# Patient Record
Sex: Female | Born: 1985 | Race: Black or African American | Hispanic: No | Marital: Married | Smoking: Never smoker
Health system: Southern US, Community
[De-identification: ages and names within clinical notes are randomized; demographics above are authoritative.]

## PROBLEM LIST (undated history)

## (undated) DIAGNOSIS — F32A Depression, unspecified: Secondary | ICD-10-CM

## (undated) DIAGNOSIS — F329 Major depressive disorder, single episode, unspecified: Secondary | ICD-10-CM

## (undated) DIAGNOSIS — R519 Headache, unspecified: Secondary | ICD-10-CM

## (undated) DIAGNOSIS — D509 Iron deficiency anemia, unspecified: Secondary | ICD-10-CM

## (undated) DIAGNOSIS — K219 Gastro-esophageal reflux disease without esophagitis: Secondary | ICD-10-CM

## (undated) DIAGNOSIS — F419 Anxiety disorder, unspecified: Secondary | ICD-10-CM

## (undated) DIAGNOSIS — N939 Abnormal uterine and vaginal bleeding, unspecified: Secondary | ICD-10-CM

## (undated) DIAGNOSIS — G43009 Migraine without aura, not intractable, without status migrainosus: Secondary | ICD-10-CM

## (undated) HISTORY — PX: REDUCTION MAMMAPLASTY: SUR839

## (undated) HISTORY — DX: Depression, unspecified: F32.A

## (undated) HISTORY — DX: Headache, unspecified: R51.9

## (undated) HISTORY — DX: Anxiety disorder, unspecified: F41.9

---
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
## 2014-04-07 DIAGNOSIS — Z903 Acquired absence of stomach [part of]: Secondary | ICD-10-CM

## 2014-04-07 HISTORY — PX: LAPAROSCOPIC GASTRIC SLEEVE RESECTION: SHX5895

## 2014-04-07 HISTORY — DX: Acquired absence of stomach (part of): Z90.3

## 2015-04-08 HISTORY — PX: BREAST BIOPSY: SHX20

## 2019-06-16 ENCOUNTER — Other Ambulatory Visit: Payer: Self-pay | Admitting: Obstetrics and Gynecology

## 2019-06-16 DIAGNOSIS — N6320 Unspecified lump in the left breast, unspecified quadrant: Secondary | ICD-10-CM

## 2019-07-01 ENCOUNTER — Other Ambulatory Visit: Payer: Self-pay

## 2019-07-01 ENCOUNTER — Ambulatory Visit
Admission: RE | Admit: 2019-07-01 | Discharge: 2019-07-01 | Disposition: A | Discharge status: Home / Self Care | Payer: BC Managed Care – PPO | Source: Ambulatory Visit | Attending: Obstetrics and Gynecology | Admitting: Obstetrics and Gynecology

## 2019-07-01 DIAGNOSIS — N6320 Unspecified lump in the left breast, unspecified quadrant: Secondary | ICD-10-CM

## 2019-07-26 NOTE — Progress Notes (Deleted)
WM[id number]    Provider:  Dr Jaynee Eagles Requesting Provider: Cyril Mourning, MD, [facility] Primary Care Provider:  Lin Landsman, MD  CC:  ***  HPI:  Cheyenne Wilson is a 34 y.o. female here as requested by Cyril Mourning, MD for transfer of care from neurology practices from [facility] neurology [place] neurologist Dr. Evalee Mutton MD.  I reviewed at least 100 pages of notes, patient has a past medical history of gastric sleeve, restless leg syndrome, and headaches, last seen May 13, 2019 for Botox injections for migraines, Roselyn Meier works acutely but only if she takes it with Excedrin.  Headache includes neck pain, photosensitivity, no tingling or numbness, she is overweight, otherwise neurologic exam is, diagnosed with chronic migraine without aura intractable without status migrainosus, other fatigue, attention and concentration deficit, vitamin B12 deficiency, insomnia, restless leg syndrome, carpal tunnel syndrome and paresthesias of the skin.For chronic migraines: 15/month, duration greater than 4 hours, apparently it was her first round of Botox injections.  Roselyn Meier as abortive.  Headache and sleep hygiene advised.For restless leg: Gabapentin, vitamin B12. For carpal tunnel encouraged use of wrist splints at night.  The headaches are usually left-sided, throbbing pressure-like in nature, photophobia and mild nauseating, twice per week at least, sumatriptan has helped but it takes a few hours, patient has lacrimation due to activation of the trigeminal pathways and migraines, headaches are not consistent with cluster headaches, also tried Zomig.  Review of notes show that she has been on the following medications that can be used for migraines: Botox for migraines, Ubrelvy, Excedrin, sumatriptan, amitriptyline up to 20mg , Maxalt, Horizant, Topamax 25, gabapentin 300mg , Zomig, Horizant 600 mg nightly  Reviewed notes, labs and imaging from outside  physicians, which showed ***  Previous B12 was 272, T4 and TSH were normal, ANA negative, sedimentation rate 23, Lyme negative, B12 272, CRP less than 1, (noted IgM P 23 antibodies were present in the Lyme testing which still does not constitute a positive test.  Labs collected Aug 27, 2018.  Tests at prior neurologist office include sleep study, EEG study, ADHD screening, transcranial Doppler study, carotid Doppler duplex scanning.  She is also had an EMG of the lower extremities from her prior neurologist constantly feels the need to move her feet.  EMG of the upper extremities was consistent with carpal tunnel syndrome, appears she has been getting B12 injections.  Reviewed carotid ultrasound report, hemodynamically normal study, minimal plaque in the visualized portion of the cerebrovascular circulation, exam date September 06, 2018.  EEG, reviewed report, completed September 07, 2018, normal routine EEG study for approximately 2 hours.  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  I reviewed EMG dated November 11, 2018 lower extremities including peroneal motors, tibial motors, superior peroneal sensory, sural sensory's bilateral which were all unremarkable, F waves and H reflex were all within normal limits, all examined muscles were normal, normal electrophysiologic study without evidence of a significant entrapment syndrome, radiculopathy or myopathy.  I reviewed MRI of the brain dated September 06, 2018, normal MRI of the brain noncontrast.  Patient had a brain fitness  profile September 06, 2018 which showed there were no cognitive areas in need of improvement, areas of strength included memory, executive function, attention, visual-spatial, verbal function, problem solving, working memory, areas needing improvement included none.  I reviewed his sleep study from Aug 31, 2018, 3 night overnight EEG study, sleep efficiency of 90.3%, 82%, 87.8% respectively but it appears patient did have insomnia per report her AHI was 0.7.  TCD confirmed the patency of the major basal intracranial arteries of the circle of Willis, no evidence of intracranial stenosis or occlusive disease, BMR testing showed normal vasodilator reactivity in the right MCA, no emboli were detected during monitoring.  Review of Systems: Patient complains of symptoms per HPI as well as the following symptoms ***. Pertinent negatives and positives per HPI. All others negative.   Social History   Socioeconomic History  . Marital status: Married    Spouse name: Not on file  . Number of children: Not on file  . Years of education: Not on file  . Highest education level: Not on file  Occupational History  . Not on file  Tobacco Use  . Smoking status: Not on file  Substance and Sexual Activity  . Alcohol use: Not on file  . Drug use: Not on file  . Sexual activity: Not on file  Other Topics Concern  . Not on file  Social History Narrative  . Not on file   Social Determinants of Health   Financial Resource Strain:   . Difficulty of Paying Living Expenses:   Food Insecurity:   . Worried About Charity fundraiser in the Last Year:   . Arboriculturist in the Last Year:   Transportation Needs:   . Film/video editor (Medical):   Marland Kitchen Lack of Transportation (Non-Medical):   Physical Activity:   . Days of Exercise per Week:   . Minutes of Exercise per Session:   Stress:   . Feeling of Stress :   Social Connections:   . Frequency of Communication with Friends and Family:   . Frequency of  Social Gatherings with Friends and Family:   . Attends Religious Services:   . Active Member of Clubs or Organizations:   . Attends Archivist Meetings:   Marland Kitchen Marital Status:   Intimate Partner Violence:   . Fear of Current or Ex-Partner:   . Emotionally Abused:   Marland Kitchen Physically Abused:   . Sexually Abused:     Family History  Problem Relation Age of Onset  . Breast cancer Mother 51    No past medical history on file.  There are no problems to display for this patient.   Past Surgical History:  Procedure Laterality Date  . BREAST BIOPSY Left 2017  . REDUCTION MAMMAPLASTY Bilateral     No current outpatient medications on file.   No current facility-administered medications for this visit.    Allergies as of 07/27/2019  . (Not on File)    Vitals: There were no vitals taken for this visit. Last Weight:  Wt Readings from Last 1 Encounters:  No data found for Wt   Last Height:   Ht Readings from Last 1 Encounters:  No data found for Ht     Physical exam: Exam: Gen: NAD, conversant, well nourised, obese, well groomed                     CV: RRR, no MRG. No Carotid  Bruits. No peripheral edema, warm, nontender Eyes: Conjunctivae clear without exudates or hemorrhage  Neuro: Detailed Neurologic Exam  Speech:    Speech is normal; fluent and spontaneous with normal comprehension.  Cognition:    The patient is oriented to person, place, and time;     recent and remote memory intact;     language fluent;     normal attention, concentration,     fund of knowledge Cranial Nerves:    The pupils are equal, round, and reactive to light. The fundi are normal and spontaneous venous pulsations are present. Visual fields are full to finger confrontation. Extraocular movements are intact. Trigeminal sensation is intact and the muscles of mastication are normal. The face is symmetric. The palate elevates in the midline. Hearing intact. Voice is normal. Shoulder shrug  is normal. The tongue has normal motion without fasciculations.   Coordination:    Normal finger to nose and heel to shin. Normal rapid alternating movements.   Gait:    Heel-toe and tandem gait are normal.   Motor Observation:    No asymmetry, no atrophy, and no involuntary movements noted. Tone:    Normal muscle tone.    Posture:    Posture is normal. normal erect    Strength:    Strength is V/V in the upper and lower limbs.      Sensation: intact to LT     Reflex Exam:  DTR's:    Deep tendon reflexes in the upper and lower extremities are normal bilaterally.   Toes:    The toes are downgoing bilaterally.   Clonus:    Clonus is absent.    Assessment/Plan:    No orders of the defined types were placed in this encounter.  No orders of the defined types were placed in this encounter.   Cc: Cyril Mourning, MD,  Lin Landsman, MD  Sarina Ill, MD  [facility] [address] [place], [place] [address]  Phone [phone] Fax [phone]

## 2019-07-27 ENCOUNTER — Other Ambulatory Visit: Payer: Self-pay

## 2019-07-27 ENCOUNTER — Ambulatory Visit: Payer: BC Managed Care – PPO | Admitting: Neurology

## 2019-07-28 ENCOUNTER — Encounter: Payer: Self-pay | Admitting: Neurology

## 2019-07-28 ENCOUNTER — Other Ambulatory Visit: Payer: Self-pay

## 2019-07-28 ENCOUNTER — Ambulatory Visit: Payer: BC Managed Care – PPO | Admitting: Neurology

## 2019-07-28 VITALS — BP 118/78 | HR 82 | Temp 97.8°F | Ht 64.0 in | Wt 223.0 lb

## 2019-07-28 DIAGNOSIS — G43019 Migraine without aura, intractable, without status migrainosus: Secondary | ICD-10-CM | POA: Diagnosis not present

## 2019-07-28 MED ORDER — AIMOVIG 140 MG/ML ~~LOC~~ SOAJ: 140.0000 mg | SUBCUTANEOUS | 5 refills | Status: DC

## 2019-07-28 MED ORDER — NURTEC 75 MG PO TBDP: 75.0000 mg | ORAL_TABLET | ORAL | 3 refills | Status: DC | PRN

## 2019-07-28 NOTE — Patient Instructions (Addendum)
As discussed, we will try you on Nurtec as opposed to [place] for your acute migraine attacks.   Nurtec 75 mg strength: Take 1 pill at onset of migraine headache, may repeat in 2 hours, no more than 2 pills in 24 hours. May cause sedation and nausea.   You have tried a failed multiple abortive and preventative treatments for your migraines.  You are a good candidate for one of the newer injectable medications, such as Aimovig, Ajovy and Emgality.   As discussed, we will start Aimovig 140 mg/ml, 1 inj subcutaneously every 30 days.  We can help you with your first injection and education, if needed.  Potential side effects include:  Gastrointestinal: Constipation (3%) Immunologic: Antibody development (3% to 6%) Local: Injection site reaction (5% to 6%) Neuromuscular & skeletal: Muscle cramps (?2%), muscle spasm (?2%) Dermatologic: Injection site reactions include itching, redness, pain at the injection site and very rarely: Anaphylaxis, angioedema (severe swelling including around mouth and tongue), hypersensitivity reaction (rare).

## 2019-07-28 NOTE — Progress Notes (Signed)
Subjective:    Patient ID: Cheyenne Wilson is a 34 y.o. female.  HPI     Star Age, MD, PhD [facility] [address] [place], [place] [address]  Dear Dr. Evalee Mutton,  I saw your patient, Cheyenne Wilson, upon your kind request, in my Neurologic clinic today for initial consultation of her migraine headaches.  The patient is unaccompanied today.  As you know, [name] is a 34 year old right-handed woman with an underlying medical history of obesity, s/p gastric sleeve, RLS and migraine headaches, who reports a longstanding history of migraine headaches.  She has no family history of migraines. She has recently moved from [place].  She has a left-sided throbbing headache, typically around the eye and behind the eye, sometimes in the back of her head.  She is currently on [place] which she does not feel has helped.  She is no longer taking Horizant, her primary care tried ropinirole for her restless legs which caused her to have nausea and vomiting.  She does not take gabapentin any longer.  She denies any visual aura, she denies any neurological accompaniments with her headache which can happen 2-3 times a week, may last 24 to 48 hours.  She is married and lives with her spouse and 2 children, ages 54 and 1, she is a Agricultural engineer.  She is a non-smoker and drinks alcohol occasionally, once or twice a week, caffeine and limitation, 1 cup of coffee per day, she tries to hydrate well and sleeps fairly well, takes melatonin at night.  She has been on Imitrex and Maxalt, neither one helped, she took botox injections, which did not help, last injection on 05/13/2019.  She had extensive evaluation through your office, all within the last year.  Upper extremity EMG nerve conduction velocity test on 01/14/2019 showed borderline carpal tunnel syndrome, lower extremity EMG nerve conduction test on 11/11/2018 showed no significant evidence of  neuropathy, carotid Doppler ultrasound on 09/06/2018 showed no significant internal carotid artery stenosis on either side.  EEG on 09/07/2018 showed: Impression: This is a normal routine EEG study.  Transcranial Doppler ultrasound on 09/06/2018 was unremarkable, MRI brain without contrast normal findings, cognitive testing with neuro trax on 09/06/2018 showed no obvious abnormalities, extended 3-night EEG from 5/26 through 09/02/2018 showed no abnormalities, sleep study on 12/06/2018 was negative for sleep apnea. She has been on several preventative medications, including Topamax, and amitriptyline which did not help.  Gabapentin did not help.  She is interested in one of the newer injectable medications for headache prevention.  Her Past Medical History Is Significant For: Past Medical History:  Diagnosis Date  . Anxiety   . Depression   . Head ache     Her Past Surgical History Is Significant For: Past Surgical History:  Procedure Laterality Date  . BREAST BIOPSY Left 2017  . REDUCTION MAMMAPLASTY Bilateral     Her Family History Is Significant For: Family History  Problem Relation Age of Onset  . Breast cancer Mother 28  . Hypertension Mother   . Heart disease Father     Her Social History Is Significant For: Social History   Socioeconomic History  . Marital status: Married    Spouse name: Not on file  . Number of children: Not on file  . Years of education: Not on file  . Highest education level: Not on file  Occupational History  . Not on file  Tobacco Use  . Smoking status: Never Smoker  .  Smokeless tobacco: Never Used  Substance and Sexual Activity  . Alcohol use: Yes    Comment: 1-2 drinks per week  . Drug use: Never  . Sexual activity: Not on file  Other Topics Concern  . Not on file  Social History Narrative  . Not on file   Social Determinants of Health   Financial Resource Strain:   . Difficulty of Paying Living Expenses:   Food Insecurity:   . Worried About  Charity fundraiser in the Last Year:   . Arboriculturist in the Last Year:   Transportation Needs:   . Film/video editor (Medical):   Marland Kitchen Lack of Transportation (Non-Medical):   Physical Activity:   . Days of Exercise per Week:   . Minutes of Exercise per Session:   Stress:   . Feeling of Stress :   Social Connections:   . Frequency of Communication with Friends and Family:   . Frequency of Social Gatherings with Friends and Family:   . Attends Religious Services:   . Active Member of Clubs or Organizations:   . Attends Archivist Meetings:   Marland Kitchen Marital Status:     Her Allergies Are:  No Known Allergies:   Her Current Medications Are:  Outpatient Encounter Medications as of 07/28/2019  Medication Sig  . CYANOCOBALAMIN IJ Inject as directed. Monthly  . Levocetirizine Dihydrochloride (XYZAL PO) Take by mouth.  Marland Kitchen PREDNISONE PO Take by mouth.  Marland Kitchen rOPINIRole HCl (REQUIP PO) Take by mouth.  . Ubrogepant (UBRELVY) 50 MG TABS Take by mouth.   No facility-administered encounter medications on file as of 07/28/2019.  :  Review of Systems:  Out of a complete 14 point review of systems, all are reviewed and negative with the exception of these symptoms as listed below:  Review of Systems  Neurological:       Here for consult on h/a. Reports 3 ha per week. Currently on ubrelvy. Has tried botox in the past but does not want to continue. Would like to discuss injections for migraine prevention.     Objective:  Neurological Exam  Physical Exam Physical Examination:   Vitals:   07/28/19 1257  BP: 118/78  Pulse: 82  Temp: 97.8 F (36.6 C)   General Examination: The patient is a very pleasant 34 y.o. female in no acute distress. She appears well-developed and well-nourished and well groomed.   HEENT: Normocephalic, atraumatic, pupils are equal, round and reactive to light and accommodation. Funduscopic exam is normal with sharp disc margins noted. Extraocular tracking  is good without limitation to gaze excursion or nystagmus noted. Normal smooth pursuit is noted. Hearing is grossly intact. Tympanic membranes are clear bilaterally. Face is symmetric with normal facial animation and normal facial sensation. Speech is clear with no dysarthria noted. There is no hypophonia. There is no lip, neck/head, jaw or voice tremor. Neck is supple with full range of passive and active motion. There are no carotid bruits on auscultation. Oropharynx exam reveals: no signif. mouth dryness, good dental hygiene, slightly longer uvula noted. Tongue protrudes centrally and palate elevates symmetrically.   Chest: Clear to auscultation without wheezing, rhonchi or crackles noted.  Heart: S1+S2+0, regular and normal without murmurs, rubs or gallops noted.   Abdomen: Soft, non-tender and non-distended with normal bowel sounds appreciated on auscultation.  Extremities: There is no pitting edema in the distal lower extremities bilaterally. Pedal pulses are intact.  Skin: Warm and dry without trophic changes noted. There  are no varicose veins.  Musculoskeletal: exam reveals no obvious joint deformities, tenderness or joint swelling or erythema.   Neurologically:  Mental status: The patient is awake, alert and oriented in all 4 spheres. Her immediate and remote memory, attention, language skills and fund of knowledge are appropriate. There is no evidence of aphasia, agnosia, apraxia or anomia. Speech is clear with normal prosody and enunciation. Thought process is linear. Mood is normal and affect is normal.  Cranial nerves II - XII are as described above under HEENT exam. In addition: shoulder shrug is normal with equal shoulder height noted. Motor exam: Normal bulk, strength and tone is noted. There is no drift, tremor or rebound. Romberg is negative. Reflexes are 2+ throughout. Babinski: Toes are flexor bilaterally. Fine motor skills and coordination: intact with normal finger taps, normal  hand movements, normal rapid alternating patting, normal foot taps and normal foot agility.  Cerebellar testing: No dysmetria or intention tremor on finger to nose testing. Heel to shin is unremarkable bilaterally. There is no truncal or gait ataxia.  Sensory exam: intact to light touch, vibration, temperature sense in the upper and lower extremities.  Gait, station and balance: She stands easily. No veering to one side is noted. No leaning to one side is noted. Posture is age-appropriate and stance is narrow based. Gait shows normal stride length and normal pace. No problems turning are noted. Tandem walk is unremarkable.   Assessment and Plan:  Assessment and Plan:  In summary, Chau C. Wilson is a very pleasant 34 y.o.-year old female with an underlying medical history of obesity, s/p gastric sleeve, RLS and migraine headaches, who presents for evaluation of her migraine headaches.  She has a longstanding history of migraines and has tried and failed multiple preventative medications including amitriptyline, gabapentin, Topamax, and Botox.  She has been on several abortive medications including Maxalt, Imitrex, and Ubrelvy, neither one helped.  She is interested in one of the newer injectable preventative medications.  She would be a good candidate for Aimovig.  I provided instructions and a new prescription for this medication we talked about expectations and potential side effects.  She is advised to try Nurtec for acute treatment, she was provided a new prescription and instructions.  She is advised not to combine Fate with this medication as both are in the same family of medications.  She has a nonfocal neurological exam.  She reports having had a eye examination within the last 6 months, she has prescription eyeglasses for astigmatism, and uses her glasses as needed.  She is advised about potential headache triggers.  She tries to hydrate well and rest well.  She had multiple  diagnostic tests through your office, I do not believe she needs any additional diagnostic evaluation from my end of things.  She is advised to follow-up routinely in our office to see one of our nurse practitioners.  I answered all her questions today and she was in agreement.   Thank you very much for allowing me to participate in the care of this nice patient. If I can be of any further assistance to you please do not hesitate to call me at [phone].  Sincerely,   Star Age, MD, PhD

## 2019-08-22 ENCOUNTER — Telehealth: Payer: Self-pay | Admitting: Neurology

## 2019-08-22 NOTE — Telephone Encounter (Signed)
Pt states that her insurance is still waiting on the order for her Erenumab-aooe (AIMOVIG) 140 MG/ML SOAJ to get the PA going and pt would like to know the update on this. Please advise.

## 2019-08-23 NOTE — Telephone Encounter (Signed)
PA done on cover my meds. [facility] is processing your inquiry and will respond shortly with next steps. You are currently using the fastest method to process this request. Please do not fax or call [facility] [employer] to resubmit this request. To check for an update later, open this request again from your dashboard. If you need assistance, please chat with CoverMyMeds or call us at [phone].

## 2019-08-23 NOTE — Telephone Encounter (Signed)
I called pt that a fax was not receive from pharmacy for [place]. I stated PA was done today and will pend for 24 to 72 hours. Pt verbalized understanding.

## 2019-08-24 NOTE — Telephone Encounter (Signed)
Revised. 

## 2019-08-24 NOTE — Telephone Encounter (Addendum)
I could not do PA on cover my meds It was initiated yesterday and questions were not on the PA. I called [phone] pt  Insurance to state PA did not imitated clinical questions. I was hold for 15 minutes to do clinical questions. I had to hang up. I will call tomorrow to get customer service on line to do PA again.

## 2019-08-25 NOTE — Telephone Encounter (Signed)
Check cover my meds, clinical questions finally there to do. Clinical questions about Aimovig send to insurance. I also fax office notes to  [phone] to cover my meds so they can upload for the pharmacy to review. Decision still pending.for 24 to 72 hours.

## 2019-08-29 NOTE — Telephone Encounter (Signed)
I called pt that the pharmacy denied the aimvovig. I advise pt to use the access card because she has Pharmacist, community.Pt verbalized understanding.

## 2019-08-30 NOTE — Telephone Encounter (Signed)
I tried to call patients insurance plan on 5/24 an 5/25 to find out about the office notes for the PA. Per pt they never receive the office notes. I call  [phone] and was on hold both times for 20 minutes.

## 2019-08-31 ENCOUNTER — Ambulatory Visit: Payer: BC Managed Care – PPO | Admitting: Neurology

## 2019-09-07 ENCOUNTER — Ambulatory Visit (INDEPENDENT_AMBULATORY_CARE_PROVIDER_SITE_OTHER): Payer: BC Managed Care – PPO | Admitting: Otolaryngology

## 2019-09-26 ENCOUNTER — Telehealth: Payer: Self-pay | Admitting: Neurology

## 2019-09-26 NOTE — Telephone Encounter (Signed)
Pt has called to report that a PA is needed for her Rimegepant Sulfate (NURTEC) 75 MG TBDP.  Pt is asking for a call to discuss this

## 2019-09-27 NOTE — Telephone Encounter (Signed)
PA has been completed via paper and will be faxed to [facility] once MD signs.

## 2019-09-28 NOTE — Telephone Encounter (Signed)
PA has been submitted for Nurtec. PA is pending and will update the pt would decision from insurance is made.

## 2019-09-30 ENCOUNTER — Telehealth: Payer: Self-pay | Admitting: Neurology

## 2019-09-30 NOTE — Telephone Encounter (Signed)
She called on Friday asking about her Nurtec preauthorization.  I let her know that you had worked on it this week and that I would forward the message to you

## 2019-10-03 NOTE — Telephone Encounter (Signed)
I contacted the pt and left a vm (ok per dpr). I advised via that the PA was still pending at [facility]. I also advised our office had technical issues on wed and Thursday and could not receive any fax or calls. Pt was advised while we are waiting on the authorization she could sign up for the co-pay card and help override the PA needed at the pharmacy...  Pt was advised to call back if she had any questions.

## 2019-10-12 ENCOUNTER — Telehealth: Payer: Self-pay

## 2019-10-12 NOTE — Telephone Encounter (Signed)
Pt called stating she is having trouble getting her Rimegepant Sulfate (NURTEC) 75 MG TBDP filled due to it needing a PA and her pharmacy informed her that the copay card was a one time use. Please advise.

## 2019-10-12 NOTE — Telephone Encounter (Signed)
I called the pt back and advised we are still waiting on the PA to be approved. I called the insurance today and was advised I could not complete a verbal PA. I have resubmitted the fax PA.  Cover my meds could not locate the eligibility of the pt when tried to submit.

## 2019-10-12 NOTE — Telephone Encounter (Signed)
Cheyenne Wilson is a 34 y.o. female returning a call to [place].   Pt was notified of the messge re: PA still pending

## 2019-10-12 NOTE — Telephone Encounter (Signed)
Thank you :)

## 2019-10-18 NOTE — Telephone Encounter (Signed)
PA has been approved through [place]-  PA effective 10/18/2019 -10/17/2020. Pharmacy has been notified.

## 2019-10-26 ENCOUNTER — Encounter: Payer: Self-pay | Admitting: Neurology

## 2019-10-26 ENCOUNTER — Ambulatory Visit: Payer: BC Managed Care – PPO | Admitting: Neurology

## 2019-10-26 ENCOUNTER — Other Ambulatory Visit: Payer: Self-pay

## 2019-10-26 VITALS — BP 122/82 | HR 68 | Ht 65.0 in | Wt 227.0 lb

## 2019-10-26 DIAGNOSIS — L299 Pruritus, unspecified: Secondary | ICD-10-CM | POA: Diagnosis not present

## 2019-10-26 DIAGNOSIS — G43019 Migraine without aura, intractable, without status migrainosus: Secondary | ICD-10-CM | POA: Diagnosis not present

## 2019-10-26 NOTE — Progress Notes (Signed)
Subjective:    Patient ID: Cheyenne Wilson is a 35 y.o. female.  HPI     Interim history:  Cheyenne Wilson is a 34 year old right-handed woman with an underlying medical history of obesity, s/p gastric sleeve, RLS and migraine headaches, who Presents for follow-up consultation of her migraine headaches.  The patient is unaccompanied today.  I first met her on 07/28/2019, at which time she reported a longstanding history of migraine headaches.  She had moved from [place].  She was under the care of a neurologist.  She had a prescription for Ubrelvy.  She was advised to start Aimovig injections.  Her insurance did not cover Nurtec.  Today, 10/26/2019: She reports that her migraines are better with the Aimovig injections, she took her first dose on June 1 and her second dose on July 1.  In the past month, she has had intermittent sensations of something crawling on or under her skin without any obvious rash, no urticaria, no actual itching.  No injection site reaction, no swelling of the lips or tongue.  She has seen ENT and dermatology.  She has not noticed any obvious connection to the Aimovig but that is the only recent medication but she also recently started phentermine.  She stopped the phentermine a few days ago and is due for her Aimovig injection on August 1.  She does believe it has helped her migraines.  She was not able to get the Nurtec prescription and has not filled her [facility] yet.    The patient's allergies, current medications, family history, past medical history, past social history, past surgical history and problem list were reviewed and updated as appropriate.   Previously:   07/28/19: (She) reports a longstanding history of migraine headaches.  She has no family history of migraines. She has recently moved from [place].  She has a left-sided throbbing headache, typically around the eye and behind the eye, sometimes in the back of her head.  She is currently on  [place] which she does not feel has helped.  She is no longer taking Horiz[place], her primary care tried ropinirole for her restless legs which caused her to have nausea and vomiting.  She does not take gabapentin any longer.  She denies any visual aura, she denies any neurological accompaniments with her headache which can happen 2-3 times a week, may last 24 to 48 hours.  She is married and lives with her spouse and 2 children, ages 18 and 34, she is a Agricultural engineer.  She is a non-smoker and drinks alcohol occasionally, once or twice a week, caffeine and limitation, 1 cup of coffee per day, she tries to hydrate well and sleeps fairly well, takes melatonin at night.   She has been on Imitrex and Maxalt, neither one helped, she took botox injections, which did not help, last injection on 05/13/2019.  She had extensive evaluation through your office, all within the last year.  Upper extremity EMG nerve conduction velocity test on 01/14/2019 showed borderline carpal tunnel syndrome, lower extremity EMG nerve conduction test on 11/11/2018 showed no significant evidence of neuropathy, carotid Doppler ultrasound on 09/06/2018 showed no significant internal carotid artery stenosis on either side.  EEG on 09/07/2018 showed: Impression: This is a normal routine EEG study.  Transcranial Doppler ultrasound on 09/06/2018 was unremarkable, MRI brain without contrast normal findings, cognitive testing with neuro trax on 09/06/2018 showed no obvious abnormalities, extended 3-night EEG from 5/26 through 09/02/2018 showed no abnormalities, sleep study on 12/06/2018 was  negative for sleep apnea. She has been on several preventative medications, including Topamax, and amitriptyline which did not help.  Gabapentin did not help.  She is interested in one of the newer injectable medications for headache prevention.  Her Past Medical History Is Significant For: Past Medical History:  Diagnosis Date  . Anxiety   . Depression   . Head ache     Her  Past Surgical History Is Significant For: Past Surgical History:  Procedure Laterality Date  . BREAST BIOPSY Left 2017  . REDUCTION MAMMAPLASTY Bilateral     Her Family History Is Significant For: Family History  Problem Relation Age of Onset  . Breast cancer Mother 42  . Hypertension Mother   . Heart disease Father     Her Social History Is Significant For: Social History   Socioeconomic History  . Marital status: Married    Spouse name: Not on file  . Number of children: Not on file  . Years of education: Not on file  . Highest education level: Not on file  Occupational History  . Not on file  Tobacco Use  . Smoking status: Never Smoker  . Smokeless tobacco: Never Used  Substance and Sexual Activity  . Alcohol use: Yes    Comment: 1-2 drinks per week  . Drug use: Never  . Sexual activity: Not on file  Other Topics Concern  . Not on file  Social History Narrative  . Not on file   Social Determinants of Health   Financial Resource Strain:   . Difficulty of Paying Living Expenses:   Food Insecurity:   . Worried About Charity fundraiser in the Last Year:   . Arboriculturist in the Last Year:   Transportation Needs:   . Film/video editor (Medical):   Marland Kitchen Lack of Transportation (Non-Medical):   Physical Activity:   . Days of Exercise per Week:   . Minutes of Exercise per Session:   Stress:   . Feeling of Stress :   Social Connections:   . Frequency of Communication with Friends and Family:   . Frequency of Social Gatherings with Friends and Family:   . Attends Religious Services:   . Active Member of Clubs or Organizations:   . Attends Archivist Meetings:   Marland Kitchen Marital Status:     Her Allergies Are:  Allergies  Allergen Reactions  . Requip [Ropinirole] Nausea Only  :   Her Current Medications Are:  Outpatient Encounter Medications as of 10/26/2019  Medication Sig  . CYANOCOBALAMIN IJ Inject as directed. Monthly  . Erenumab-aooe  (AIMOVIG) 140 MG/ML SOAJ Inject 140 mg into the skin every 30 (thirty) days.  . Levocetirizine Dihydrochloride (XYZAL PO) Take by mouth.  Marland Kitchen PREDNISONE PO Take by mouth as needed.   Marland Kitchen Ubrogepant (UBRELVY) 50 MG TABS Take by mouth.  . [DISCONTINUED] Rimegepant Sulfate (NURTEC) 75 MG TBDP Take 75 mg by mouth as needed (may repeat after 2 hours, no more than 2 pills in 24 h).  . [DISCONTINUED] rOPINIRole HCl (REQUIP PO) Take by mouth.   No facility-administered encounter medications on file as of 10/26/2019.  :  Review of Systems:  Out of a complete 14 point review of systems, all are reviewed and negative with the exception of these symptoms as listed below: Review of Systems  Neurological:       Pt her for 3 month f/u- reports h/a have been better controled.  She reports constant feeling of  skin crawling- reports these sx are present during the day and at night ( keeps her up some). She sts her PCP reccommended she speak with her neurologist.     Objective:  Neurological Exam  Physical Exam Physical Examination:   Vitals:   10/26/19 1529  BP: 122/82  Pulse: 68    General Examination: The patient is a very pleasant 34 y.o. female in no acute distress. She appears well-developed and well-nourished and well groomed.   HEENT: Normocephalic, atraumatic, pupils are equal, round and reactive to light. Extraocular tracking is good without limitation to gaze excursion or nystagmus noted. Normal smooth pursuit is noted. Hearing is grossly intact. Face is symmetric with normal facial animation and normal facial sensation. Speech is clear with no dysarthria noted. There is no hypophonia. There is no lip, neck/head, jaw or voice tremor. Neck is supple with full range of passive and active motion. There are no carotid bruits on auscultation. Oropharynx exam reveals: no signif. mouth dryness, good dental hygiene, slightly longer uvula noted. Tongue protrudes centrally and palate elevates  symmetrically.   Chest: Clear to auscultation without wheezing, rhonchi or crackles noted.  Heart: S1+S2+0, regular and normal without murmurs, rubs or gallops noted.   Abdomen: Soft, non-tender and non-distended with normal bowel sounds appreciated on auscultation.  Extremities: There is no pitting edema in the distal lower extremities bilaterally.   Skin: Warm and dry without trophic changes noted.  No signs of rash or blisters in the exposed parts of her arms and legs or face, no signs of scratching, no swelling of the tongue or lips.  Musculoskeletal: exam reveals no obvious joint deformities, tenderness or joint swelling or erythema.   Neurologically:  Mental status: The patient is awake, alert and oriented in all 4 spheres. Her immediate and remote memory, attention, language skills and fund of knowledge are appropriate. There is no evidence of aphasia, agnosia, apraxia or anomia. Speech is clear with normal prosody and enunciation. Thought process is linear. Mood is normal and affect is normal.  Cranial nerves II - XII are as described above under HEENT exam.  Motor exam: Normal bulk, strength and tone is noted. There is no drift, tremor or rebound. Romberg is negative. Reflexes are 2+ throughout. Fine motor skills and coordination: intact grossly.  Cerebellar testing: No dysmetria or intention tremor. There is no truncal or gait ataxia.  Sensory exam: intact to light touch, vibration, temperature sense in the upper and lower extremities.  Gait, station and balance: She stands easily. No veering to one side is noted. No leaning to one side is noted. Posture is age-appropriate and stance is narrow based. Gait shows normal stride length and normal pace. No problems turning are noted. Tandem walk is unremarkable.   Assessment and Plan:   In summary, Cheyenne Wilson is a very pleasant 34 year old female with an underlying medical history of obesity, s/p gastric sleeve,  RLS and migraine headaches, who presents for follow-up consultation of her migraine headaches.  She has a longstanding history of migraines and has previously tried and failed multiple abortive and preventative medications including amitriptyline, gabapentin, Topamax, and Botox, Maxalt, Imitrex, and Ubrelvy.  She started Aimovig injections in June and has thus far taken 2 injections.  She believes that the injections have helped.  She has developed in the past month intermittent sensation of something crawling overall under her skin without any obvious rash or allergic reaction or systemic symptoms otherwise.  I am not sure that this  is related to the Aimovig injection but suggested she could skip the next is and maybe even the September dose and resume it in October if she feels unchanged.  If she feels better after skipping the dose, perhaps there is a chance that her symptoms could be related to the [place] but I have never seen her like this.  She is advised to also consider talking to her prescribing physician about the phentermine and if there could be a relation to her symptoms.  If need be, we can consider another injectable such as Ajovy or Emgality.  She is encouraged to fill the prescription for Ubrelvy for as needed use.  She is advised to follow-up routinely with one of our nurse practitioners in 3 months, sooner if needed.  I answered all her questions today and she was in agreement.   I spent 20 minutes in total face-to-face time and in reviewing records during pre-charting, more than 50% of which was spent in counseling and coordination of care, reviewing test results, reviewing medications and treatment regimen and/or in discussing or reviewing the diagnosis of migraines, the prognosis and treatment options. Pertinent laboratory and imaging test results that were available during this visit with the patient were reviewed by me and considered in my medical decision making (see chart for details).

## 2019-10-26 NOTE — Patient Instructions (Signed)
I am afraid I do not have a good explanation for your crawling sensation in your skin, there is no obvious evidence of a rash or allergic reaction, no swelling or redness noted.  Nevertheless, since you are skin sensation started after you start the Aimovig injections, please skip the next injection which should be due early August and if need be you can also skip September.  Let us know how you are doing.  If you feel that symptoms improve, we can consider another injectable migraine medication.  In the interim, please fill your prescription for Ubrelvy for as needed use so you have a medication in case you have a flareup of your migraines.  I am not convinced that your symptoms are not from the [place] but since this is the only medication you start it recently, it could be connected.  You have also started phentermine, please talk to your prescribing physician about the possibility of your symptoms related to phentermine.   Follow-up in about 3 months to see one of our nurse practitioners and call us with any interim questions or concerns.  If you feel unchanged and you would like to resume your injections in September you can or you can resume them in October if you feel unchanged.

## 2019-10-31 ENCOUNTER — Ambulatory Visit: Payer: BC Managed Care – PPO | Admitting: Family Medicine

## 2019-11-14 ENCOUNTER — Ambulatory Visit: Payer: BC Managed Care – PPO | Admitting: Neurology

## 2020-01-31 ENCOUNTER — Encounter: Payer: Self-pay | Admitting: Family Medicine

## 2020-01-31 ENCOUNTER — Ambulatory Visit: Payer: BC Managed Care – PPO | Admitting: Family Medicine

## 2020-01-31 VITALS — BP 103/70 | HR 94 | Ht 65.0 in | Wt 202.0 lb

## 2020-01-31 DIAGNOSIS — G43019 Migraine without aura, intractable, without status migrainosus: Secondary | ICD-10-CM

## 2020-01-31 DIAGNOSIS — G5601 Carpal tunnel syndrome, right upper limb: Secondary | ICD-10-CM

## 2020-01-31 NOTE — Progress Notes (Addendum)
Chief Complaint  Patient presents with  . Follow-up    rm 2, alone, Pt states migraines are more frequent, stopped aimovig     HISTORY OF PRESENT ILLNESS: Today 01/31/20  Cheyenne Wilson is a 34 y.o. female here today for follow up for migraines. Cheyenne Wilson was seen by [facility] neurology who recommended Cheyenne Wilson restart Amovig and Nurtec. 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  No cervical radiculopathy identified. Cheyenne Wilson has follow up scheduled with [facility] neurology on 02/06/2020 for f/u. Cheyenne Wilson reports seeing psychiatry and diagnosed with tactile hallucinations. Cheyenne Wilson was started on fluoxetine and lamotrigine that has helped her sleep. Cheyenne Wilson also takes clonazepam as needed. Cheyenne Wilson reports crawling sensations have improved. Cheyenne Wilson has not restarted Amovig. Cheyenne Wilson does report significant reduction in headache intensity and frequency when taking Amovig. Cheyenne Wilson has 12-15 headaches per month, most are migrainous. Cheyenne Wilson has taken full pack of Nurtec every month. Cheyenne Wilson is wearing a carpal tunnel brace that has helped.    HISTORY (copied from Dr Guadelupe Sabin note on 10/26/2019)  Cheyenne Wilson is a 34 year old right-handed woman with an underlying medical history of obesity, s/p gastric sleeve, RLS and migraine headaches, who Presents for follow-up consultation of her migraine headaches.  The patient is unaccompanied today.  I first met her on 07/28/2019, at which time Cheyenne Wilson reported a longstanding history of migraine headaches.  Cheyenne Wilson had moved from [place].  Cheyenne Wilson was under the care of a neurologist.  Cheyenne Wilson had a prescription for Ubrelvy.  Cheyenne Wilson was advised to start Aimovig injections.  Her insurance did not cover Nurtec.  Today, 10/26/2019: Cheyenne Wilson reports that her migraines are better with the Aimovig injections, Cheyenne Wilson took her first dose on June 1 and her second dose on July 1.  In the past month, Cheyenne Wilson has had intermittent sensations of something crawling on or under her skin without any obvious rash, no urticaria, no  actual itching.  No injection site reaction, no swelling of the lips or tongue.  Cheyenne Wilson has seen ENT and dermatology.  Cheyenne Wilson has not noticed any obvious connection to the Aimovig but that is the only recent medication but Cheyenne Wilson also recently started phentermine.  Cheyenne Wilson stopped the phentermine a few days ago and is due for her Aimovig injection on August 1.  Cheyenne Wilson does believe it has helped her migraines.  Cheyenne Wilson was not able to get the Nurtec prescription and has not filled her Roselyn Meier yet.    The patient's allergies, current medications, family history, past medical history, past social history, past surgical history and problem list were reviewed and updated as appropriate.   Previously:   07/28/19: (Cheyenne Wilson) reportsa longstanding history of migraine headaches. Cheyenne Wilson has no family history of migraines. Cheyenne Wilson has recently moved from [place]. Cheyenne Wilson has a left-sided throbbing headache, typically around the eye and behind the eye, sometimes in the back of her head. Cheyenne Wilson is currently on [place] which Cheyenne Wilson does not feel has helped. Cheyenne Wilson is no longer taking Horizant, her primary care tried ropinirole for her restless legs which caused her to have nausea and vomiting. Cheyenne Wilson does not take gabapentin any longer. Cheyenne Wilson denies any visual aura, Cheyenne Wilson denies any neurological accompaniments with her headache which can happen 2-3 times a week, may last 24 to 48 hours. 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 Cheyenne Wilson is a non-smoker and drinks alcohol occasionally, once or twice a week, caffeine and limitation, 1 cup of  coffee per day, Cheyenne Wilson tries to hydrate well and sleeps fairly well, takes melatonin at night.  Cheyenne Wilson has been on Imitrex and Maxalt, neither one helped, Cheyenne Wilson took botox injections, which did not help, last injection on2/08/2019. Cheyenne Wilson had extensive evaluation through your office, all within the last year. Upper extremity EMG nerve conduction velocity test on 01/14/2019 showed borderline  carpal tunnel syndrome, lower extremity EMG nerve conduction test on 11/11/2018 showed no significant evidence of neuropathy, carotid Doppler ultrasound on 09/06/2018 showed no significant internal carotid artery stenosis on either side. EEG on 09/07/2018 showed: Impression: This is a normal routine EEG study. Transcranial Doppler ultrasound on 09/06/2018 was unremarkable, MRI brain without contrast normal findings, cognitive testing with neuro traxon 09/06/2018 showed no obvious abnormalities, extended 3-night EEG from 5/26 through 09/02/2018 showed no abnormalities, sleep study on 12/06/2018 was negative for sleep apnea. Cheyenne Wilson has been on several preventative medications, including Topamax,and amitriptyline which did not help. Gabapentin did not help. Cheyenne Wilson is interested in one of the newer injectable medications for headache prevention.    REVIEW OF SYSTEMS: Out of a complete 14 system review of symptoms, the patient complains only of the following symptoms, headaches, tingling and weakness of right hand, anxiety, depression and all other reviewed systems are negative.   ALLERGIES: Allergies  Allergen Reactions  . Requip [Ropinirole] Nausea Only     HOME MEDICATIONS: Outpatient Medications Prior to Visit  Medication Sig Dispense Refill  . clonazePAM (KLONOPIN) 1 MG tablet Take 1 mg by mouth at bedtime as needed.    Marland Kitchen FLUoxetine (PROZAC) 20 MG capsule Take 20 mg by mouth daily.    Marland Kitchen lamoTRIgine (LAMICTAL) 25 MG tablet Take by mouth.    . Rimegepant Sulfate (NURTEC) 75 MG TBDP Take by mouth.    . traZODone (DESYREL) 100 MG tablet     . Erenumab-aooe (AIMOVIG) 140 MG/ML SOAJ Inject 140 mg into the skin every 30 (thirty) days. (Patient not taking: Reported on 01/31/2020) 1 pen 5  . CYANOCOBALAMIN IJ Inject as directed. Monthly    . Levocetirizine Dihydrochloride (XYZAL PO) Take by mouth.    Marland Kitchen PREDNISONE PO Take by mouth as needed.     Marland Kitchen Ubrogepant (UBRELVY) 50 MG TABS Take by mouth.     No  facility-administered medications prior to visit.     PAST MEDICAL HISTORY: Past Medical History:  Diagnosis Date  . Anxiety   . Depression   . Head ache      PAST SURGICAL HISTORY: Past Surgical History:  Procedure Laterality Date  . BREAST BIOPSY Left 2017  . REDUCTION MAMMAPLASTY Bilateral      FAMILY HISTORY: Family History  Problem Relation Age of Onset  . Breast cancer Mother 56  . Hypertension Mother   . Heart disease Father      SOCIAL HISTORY: Social History   Socioeconomic History  . Marital status: Married    Spouse name: Not on file  . Number of children: Not on file  . Years of education: Not on file  . Highest education level: Not on file  Occupational History  . Not on file  Tobacco Use  . Smoking status: Never Smoker  . Smokeless tobacco: Never Used  Substance and Sexual Activity  . Alcohol use: Yes    Comment: 1-2 drinks per week  . Drug use: Never  . Sexual activity: Not on file  Other Topics Concern  . Not on file  Social History Narrative  . Not on file  Social Determinants of Health   Financial Resource Strain:   . Difficulty of Paying Living Expenses: Not on file  Food Insecurity:   . Worried About Charity fundraiser in the Last Year: Not on file  . Ran Out of Food in the Last Year: Not on file  Transportation Needs:   . Lack of Transportation (Medical): Not on file  . Lack of Transportation (Non-Medical): Not on file  Physical Activity:   . Days of Exercise per Week: Not on file  . Minutes of Exercise per Session: Not on file  Stress:   . Feeling of Stress : Not on file  Social Connections:   . Frequency of Communication with Friends and Family: Not on file  . Frequency of Social Gatherings with Friends and Family: Not on file  . Attends Religious Services: Not on file  . Active Member of Clubs or Organizations: Not on file  . Attends Archivist Meetings: Not on file  . Marital Status: Not on file  Intimate  Partner Violence:   . Fear of Current or Ex-Partner: Not on file  . Emotionally Abused: Not on file  . Physically Abused: Not on file  . Sexually Abused: Not on file      PHYSICAL EXAM  Vitals:   01/31/20 0919  BP: 103/70  Pulse: 94  Weight: 202 lb (91.6 kg)  Height: 5' 5" (1.651 m)   Body mass index is 33.61 kg/m.   Generalized: Well developed, in no acute distress   Neurological examination  Mentation: Alert oriented to time, place, history taking. Follows all commands speech and language fluent Cranial nerve II-XII: Pupils were equal round reactive to light. Extraocular movements were full, visual field were full  Motor: The motor testing reveals 5 over 5 strength of all 4 extremities. Good symmetric motor tone is noted throughout.  Gait and station: Gait is normal.     DIAGNOSTIC DATA (LABS, IMAGING, TESTING) - I reviewed patient records, labs, notes, testing and imaging myself where available.  No results found for: WBC, HGB, HCT, MCV, PLT No results found for: NA, K, CL, CO2, GLUCOSE, BUN, CREATININE, CALCIUM, PROT, ALBUMIN, AST, ALT, ALKPHOS, BILITOT, GFRNONAA, GFRAA No results found for: CHOL, HDL, LDLCALC, LDLDIRECT, TRIG, CHOLHDL No results found for: HGBA1C No results found for: VITAMINB12 No results found for: TSH    ASSESSMENT AND PLAN  34 y.o. year old female  has a past medical history of Anxiety, Depression, and Head ache. here with   Intractable migraine without aura and without status migrainosus  [place] continues to have regular migrainous headaches. Cheyenne Wilson was seen for second opinion by [facility] and advised to restart Amovig. Cheyenne Wilson has not yet done so and reports headaches responded favorably to Amovig in the past. I have encouraged her to restart Amovig as directed by [facility]. Cheyenne Wilson will continue Nurtec for abortive therapy but was advised against regular use. Headache diary recommended. Cheyenne Wilson will continue follow up with [facility] as directed. Cheyenne Wilson has  scheduled visit on 02/06/2020. Cheyenne Wilson was encouraged to continue wrist brace. Healthy lifestyle habits recommended. Cheyenne Wilson will continue close follow up with PCP and psychiatry as directed. Has appt with psychiatry tomorrow. Cheyenne Wilson was advised that no further follow up needed with our office at this time. Cheyenne Wilson verbalizes understanding and agreement with this plan.   I spent 20 minutes of face-to-face and non-face-to-face time with patient.  This included previsit chart review, lab review, study review, order entry, electronic health record documentation,  patient education.    Debbora Presto, MSN, FNP-C 01/31/2020, 9:30 AM  [facility] [address] [place], [place] [address] [phone]  I reviewed the above note and documentation by the Nurse Practitioner and agree with the history, exam, assessment and plan as outlined above. I was available for consultation. Star Age, MD, PhD [facility])

## 2020-01-31 NOTE — Patient Instructions (Addendum)
Below is our plan:  I recommend restarting Amovig as previously directed by [facility], Dr Heide Spark. Continue Nurtec for abortive therapy. Continue wearing brace for CTS. Follow up with psychiatry as directed.   Please make sure you are staying well hydrated. I recommend 50-60 ounces daily. Well balanced diet and regular exercise encouraged.    Please continue follow up with care team as directed.   Follow up with [facility] as directed.   You may receive a survey regarding today's visit. I encourage you to leave honest feed back as I do use this information to improve patient care. Thank you for seeing me today!     Migraine Headache A migraine headache is a very strong throbbing pain on one side or both sides of your head. This type of headache can also cause other symptoms. It can last from 4 hours to 3 days. Talk with your doctor about what things may bring on (trigger) this condition. What are the causes? The exact cause of this condition is not known. This condition may be triggered or caused by:  Drinking alcohol.  Smoking.  Taking medicines, such as: ? Medicine used to treat chest pain (nitroglycerin). ? Birth control pills. ? Estrogen. ? Some blood pressure medicines.  Eating or drinking certain products.  Doing physical activity. Other things that may trigger a migraine headache include:  Having a menstrual period.  Pregnancy.  Hunger.  Stress.  Not getting enough sleep or getting too much sleep.  Weather changes.  Tiredness (fatigue). What increases the risk?  Being 50-90 years old.  Being female.  Having a family history of migraine headaches.  Being Caucasian.  Having depression or anxiety.  Being very overweight. What are the signs or symptoms?  A throbbing pain. This pain may: ? Happen in any area of the head, such as on one side or both sides. ? Make it hard to do daily activities. ? Get worse with physical activity. ? Get worse  around bright lights or loud noises.  Other symptoms may include: ? Feeling sick to your stomach (nauseous). ? Vomiting. ? Dizziness. ? Being sensitive to bright lights, loud noises, or smells.  Before you get a migraine headache, you may get warning signs (an aura). An aura may include: ? Seeing flashing lights or having blind spots. ? Seeing bright spots, halos, or zigzag lines. ? Having tunnel vision or blurred vision. ? Having numbness or a tingling feeling. ? Having trouble talking. ? Having weak muscles.  Some people have symptoms after a migraine headache (postdromal phase), such as: ? Tiredness. ? Trouble thinking (concentrating). How is this treated?  Taking medicines that: ? Relieve pain. ? Relieve the feeling of being sick to your stomach. ? Prevent migraine headaches.  Treatment may also include: ? Having acupuncture. ? Avoiding foods that bring on migraine headaches. ? Learning ways to control your body functions (biofeedback). ? Therapy to help you know and deal with negative thoughts (cognitive behavioral therapy). Follow these instructions at home: Medicines  Take over-the-counter and prescription medicines only as told by your doctor.  Ask your doctor if the medicine prescribed to you: ? Requires you to avoid driving or using heavy machinery. ? Can cause trouble pooping (constipation). You may need to take these steps to prevent or treat trouble pooping:  Drink enough fluid to keep your pee (urine) pale yellow.  Take over-the-counter or prescription medicines.  Eat foods that are high in fiber. These include beans, whole grains, and fresh  fruits and vegetables.  Limit foods that are high in fat and sugar. These include fried or sweet foods. Lifestyle  Do not drink alcohol.  Do not use any products that contain nicotine or tobacco, such as cigarettes, e-cigarettes, and chewing tobacco. If you need help quitting, ask your doctor.  Get at least 8  hours of sleep every night.  Limit and deal with stress. General instructions      Keep a journal to find out what may bring on your migraine headaches. For example, write down: ? What you eat and drink. ? How much sleep you get. ? Any change in what you eat or drink. ? Any change in your medicines.  If you have a migraine headache: ? Avoid things that make your symptoms worse, such as bright lights. ? It may help to lie down in a dark, quiet room. ? Do not drive or use heavy machinery. ? Ask your doctor what activities are safe for you.  Keep all follow-up visits as told by your doctor. This is important. Contact a doctor if:  You get a migraine headache that is different or worse than others you have had.  You have more than 15 headache days in one month. Get help right away if:  Your migraine headache gets very bad.  Your migraine headache lasts longer than 72 hours.  You have a fever.  You have a stiff neck.  You have trouble seeing.  Your muscles feel weak or like you cannot control them.  You start to lose your balance a lot.  You start to have trouble walking.  You pass out (faint).  You have a seizure. Summary  A migraine headache is a very strong throbbing pain on one side or both sides of your head. These headaches can also cause other symptoms.  This condition may be treated with medicines and changes to your lifestyle.  Keep a journal to find out what may bring on your migraine headaches.  Contact a doctor if you get a migraine headache that is different or worse than others you have had.  Contact your doctor if you have more than 15 headache days in a month. This information is not intended to replace advice given to you by your health care provider. Make sure you discuss any questions you have with your health care provider. Document Revised: 07/16/2018 Document Reviewed: 05/06/2018 Elsevier Patient Education  [place].

## 2020-04-02 ENCOUNTER — Telehealth: Payer: Self-pay

## 2020-04-02 NOTE — Telephone Encounter (Signed)
Received a PA request for pt's aimovig. Key: [id number]. Attempted via CMM. Error received that pt is inactive thru [employer]. I see no other insurance information in her chart.  I called pt to discuss. No answer, left a message asking her to call us back. If pt calls back please obtain her current insurance information if she would like [place] to complete a PA for aimovig.

## 2020-06-14 ENCOUNTER — Institutional Professional Consult (permissible substitution): Payer: BC Managed Care – PPO | Admitting: Plastic Surgery

## 2020-08-09 NOTE — Patient Instructions (Incomplete)
Below is our plan:  We will ***  Please make sure you are staying well hydrated. I recommend 50-60 ounces daily. Well balanced diet and regular exercise encouraged. Consistent sleep schedule with 6-8 hours recommended.   Please continue follow up with care team as directed.   Follow up with *** in ***  You may receive a survey regarding today's visit. I encourage you to leave honest feed back as I do use this information to improve patient care. Thank you for seeing me today!      Migraine Headache A migraine headache is a very strong throbbing pain on one side or both sides of your head. This type of headache can also cause other symptoms. It can last from 4 hours to 3 days. Talk with your doctor about what things may bring on (trigger) this condition. What are the causes? The exact cause of this condition is not known. This condition may be triggered or caused by:  Drinking alcohol.  Smoking.  Taking medicines, such as: ? Medicine used to treat chest pain (nitroglycerin). ? Birth control pills. ? Estrogen. ? Some blood pressure medicines.  Eating or drinking certain products.  Doing physical activity. Other things that may trigger a migraine headache include:  Having a menstrual period.  Pregnancy.  Hunger.  Stress.  Not getting enough sleep or getting too much sleep.  Weather changes.  Tiredness (fatigue). What increases the risk?  Being 25-55 years old.  Being female.  Having a family history of migraine headaches.  Being Caucasian.  Having depression or anxiety.  Being very overweight. What are the signs or symptoms?  A throbbing pain. This pain may: ? Happen in any area of the head, such as on one side or both sides. ? Make it hard to do daily activities. ? Get worse with physical activity. ? Get worse around bright lights or loud noises.  Other symptoms may include: ? Feeling sick to your stomach  (nauseous). ? Vomiting. ? Dizziness. ? Being sensitive to bright lights, loud noises, or smells.  Before you get a migraine headache, you may get warning signs (an aura). An aura may include: ? Seeing flashing lights or having blind spots. ? Seeing bright spots, halos, or zigzag lines. ? Having tunnel vision or blurred vision. ? Having numbness or a tingling feeling. ? Having trouble talking. ? Having weak muscles.  Some people have symptoms after a migraine headache (postdromal phase), such as: ? Tiredness. ? Trouble thinking (concentrating). How is this treated?  Taking medicines that: ? Relieve pain. ? Relieve the feeling of being sick to your stomach. ? Prevent migraine headaches.  Treatment may also include: ? Having acupuncture. ? Avoiding foods that bring on migraine headaches. ? Learning ways to control your body functions (biofeedback). ? Therapy to help you know and deal with negative thoughts (cognitive behavioral therapy). Follow these instructions at home: Medicines  Take over-the-counter and prescription medicines only as told by your doctor.  Ask your doctor if the medicine prescribed to you: ? Requires you to avoid driving or using heavy machinery. ? Can cause trouble pooping (constipation). You may need to take these steps to prevent or treat trouble pooping:  Drink enough fluid to keep your pee (urine) pale yellow.  Take over-the-counter or prescription medicines.  Eat foods that are high in fiber. These include beans, whole grains, and fresh fruits and vegetables.  Limit foods that are high in fat and sugar. These include fried or sweet foods. Lifestyle    Do not drink alcohol.  Do not use any products that contain nicotine or tobacco, such as cigarettes, e-cigarettes, and chewing tobacco. If you need help quitting, ask your doctor.  Get at least 8 hours of sleep every night.  Limit and deal with stress. General instructions  Keep a journal to  find out what may bring on your migraine headaches. For example, write down: ? What you eat and drink. ? How much sleep you get. ? Any change in what you eat or drink. ? Any change in your medicines.  If you have a migraine headache: ? Avoid things that make your symptoms worse, such as bright lights. ? It may help to lie down in a dark, quiet room. ? Do not drive or use heavy machinery. ? Ask your doctor what activities are safe for you.  Keep all follow-up visits as told by your doctor. This is important.      Contact a doctor if:  You get a migraine headache that is different or worse than others you have had.  You have more than 15 headache days in one month. Get help right away if:  Your migraine headache gets very bad.  Your migraine headache lasts longer than 72 hours.  You have a fever.  You have a stiff neck.  You have trouble seeing.  Your muscles feel weak or like you cannot control them.  You start to lose your balance a lot.  You start to have trouble walking.  You pass out (faint).  You have a seizure. Summary  A migraine headache is a very strong throbbing pain on one side or both sides of your head. These headaches can also cause other symptoms.  This condition may be treated with medicines and changes to your lifestyle.  Keep a journal to find out what may bring on your migraine headaches.  Contact a doctor if you get a migraine headache that is different or worse than others you have had.  Contact your doctor if you have more than 15 headache days in a month. This information is not intended to replace advice given to you by your health care provider. Make sure you discuss any questions you have with your health care provider. Document Revised: 07/16/2018 Document Reviewed: 05/06/2018 Elsevier Patient Education  2021 [employer]  

## 2020-08-09 NOTE — Progress Notes (Deleted)
No chief complaint on file.    HISTORY OF PRESENT ILLNESS: 08/09/20 ALL: Cheyenne Wilson returns for follow up for migraines. She was last seen 01/2020 and advised to restart Amovig as recommended by [facility] neurology. She was advised to continue follow up with [facility].    01/31/2020 ALL:  Cheyenne Wilson is a 35 y.o. female here today for follow up for migraines. She was seen by [facility] neurology who recommended she restart Amovig and Nurtec. 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  No cervical radiculopathy identified. She has follow up scheduled with [facility] neurology on 02/06/2020 for f/u. She reports seeing psychiatry and diagnosed with tactile hallucinations. She was started on fluoxetine and lamotrigine that has helped her sleep. She also takes clonazepam as needed. She reports crawling sensations have improved. She has not restarted Amovig. She does report significant reduction in headache intensity and frequency when taking Amovig. She has 12-15 headaches per month, most are migrainous. She has taken full pack of Nurtec every month. She is wearing a carpal tunnel brace that has helped.    HISTORY (copied from Dr Guadelupe Sabin note on 10/26/2019)  [name] is a 35 year old right-handed woman with an underlying medical history of obesity, s/p gastric sleeve, RLS and migraine headaches, who Presents for follow-up consultation of her migraine headaches.  The patient is unaccompanied today.  I first met her on 07/28/2019, at which time she reported a longstanding history of migraine headaches.  She had moved from [place].  She was under the care of a neurologist.  She had a prescription for Ubrelvy.  She was advised to start Aimovig injections.  Her insurance did not cover Nurtec.  Today, 10/26/2019: She reports that her migraines are better with the Aimovig injections, she took her first dose on June 1 and her second dose on July 1.  In the past month, she has had  intermittent sensations of something crawling on or under her skin without any obvious rash, no urticaria, no actual itching.  No injection site reaction, no swelling of the lips or tongue.  She has seen ENT and dermatology.  She has not noticed any obvious connection to the Aimovig but that is the only recent medication but she also recently started phentermine.  She stopped the phentermine a few days ago and is due for her Aimovig injection on August 1.  She does believe it has helped her migraines.  She was not able to get the Nurtec prescription and has not filled her Roselyn Meier yet.    The patient's allergies, current medications, family history, past medical history, past social history, past surgical history and problem list were reviewed and updated as appropriate.   Previously:   07/28/19: (She) reportsa longstanding history of migraine headaches. She has no family history of migraines. She has recently moved from [place]. She has a left-sided throbbing headache, typically around the eye and behind the eye, sometimes in the back of her head. She is currently on [place] which she does not feel has helped. She is no longer taking Horizant, her primary care tried ropinirole for her restless legs which caused her to have nausea and vomiting. She does not take gabapentin any longer. She denies any visual aura, she denies any neurological accompaniments with her headache which can happen 2-3 times a week, may last 24 to 48 hours. She is married and lives with her spouse and 2 children, ages 53 and 70, she is a Agricultural engineer. She is  a non-smoker and drinks alcohol occasionally, once or twice a week, caffeine and limitation, 1 cup of coffee per day, she tries to hydrate well and sleeps fairly well, takes melatonin at night.  She has been on Imitrex and Maxalt, neither one helped, she took botox injections, which did not help, last injection on2/08/2019. She had extensive evaluation through your office,  all within the last year. Upper extremity EMG nerve conduction velocity test on 01/14/2019 showed borderline carpal tunnel syndrome, lower extremity EMG nerve conduction test on 11/11/2018 showed no significant evidence of neuropathy, carotid Doppler ultrasound on 09/06/2018 showed no significant internal carotid artery stenosis on either side. EEG on 09/07/2018 showed: Impression: This is a normal routine EEG study. Transcranial Doppler ultrasound on 09/06/2018 was unremarkable, MRI brain without contrast normal findings, cognitive testing with neuro traxon 09/06/2018 showed no obvious abnormalities, extended 3-night EEG from 5/26 through 09/02/2018 showed no abnormalities, sleep study on 12/06/2018 was negative for sleep apnea. She has been on several preventative medications, including Topamax,and amitriptyline which did not help. Gabapentin did not help. She is interested in one of the newer injectable medications for headache prevention.    REVIEW OF SYSTEMS: Out of a complete 14 system review of symptoms, the patient complains only of the following symptoms, headaches, tingling and weakness of right hand, anxiety, depression and all other reviewed systems are negative.   ALLERGIES: Allergies  Allergen Reactions  . Requip [Ropinirole] Nausea Only     HOME MEDICATIONS: Outpatient Medications Prior to Visit  Medication Sig Dispense Refill  . clonazePAM (KLONOPIN) 1 MG tablet Take 1 mg by mouth at bedtime as needed.    Eduard Roux (AIMOVIG) 140 MG/ML SOAJ Inject 140 mg into the skin every 30 (thirty) days. (Patient not taking: Reported on 01/31/2020) 1 pen 5  . FLUoxetine (PROZAC) 20 MG capsule Take 20 mg by mouth daily.    Marland Kitchen lamoTRIgine (LAMICTAL) 25 MG tablet Take by mouth.    . Rimegepant Sulfate (NURTEC) 75 MG TBDP Take by mouth.    . traZODone (DESYREL) 100 MG tablet      No facility-administered medications prior to visit.     PAST MEDICAL HISTORY: Past Medical History:   Diagnosis Date  . Anxiety   . Depression   . Head ache      PAST SURGICAL HISTORY: Past Surgical History:  Procedure Laterality Date  . BREAST BIOPSY Left 2017  . REDUCTION MAMMAPLASTY Bilateral      FAMILY HISTORY: Family History  Problem Relation Age of Onset  . Breast cancer Mother 55  . Hypertension Mother   . Heart disease Father      SOCIAL HISTORY: Social History   Socioeconomic History  . Marital status: Married    Spouse name: Not on file  . Number of children: Not on file  . Years of education: Not on file  . Highest education level: Not on file  Occupational History  . Not on file  Tobacco Use  . Smoking status: Never Smoker  . Smokeless tobacco: Never Used  Substance and Sexual Activity  . Alcohol use: Yes    Comment: 1-2 drinks per week  . Drug use: Never  . Sexual activity: Not on file  Other Topics Concern  . Not on file  Social History Narrative  . Not on file   Social Determinants of Health   Financial Resource Strain: Not on file  Food Insecurity: Not on file  Transportation Needs: Not on file  Physical Activity: Not  on file  Stress: Not on file  Social Connections: Not on file  Intimate Partner Violence: Not on file      PHYSICAL EXAM  There were no vitals filed for this visit. There is no height or weight on file to calculate BMI.   Generalized: Well developed, in no acute distress   Neurological examination  Mentation: Alert oriented to time, place, history taking. Follows all commands speech and language fluent Cranial nerve II-XII: Pupils were equal round reactive to light. Extraocular movements were full, visual field were full  Motor: The motor testing reveals 5 over 5 strength of all 4 extremities. Good symmetric motor tone is noted throughout.  Gait and station: Gait is normal.     DIAGNOSTIC DATA (LABS, IMAGING, TESTING) - I reviewed patient records, labs, notes, testing and imaging myself where  available.  No results found for: WBC, HGB, HCT, MCV, PLT No results found for: NA, K, CL, CO2, GLUCOSE, BUN, CREATININE, CALCIUM, PROT, ALBUMIN, AST, ALT, ALKPHOS, BILITOT, GFRNONAA, GFRAA No results found for: CHOL, HDL, LDLCALC, LDLDIRECT, TRIG, CHOLHDL No results found for: HGBA1C No results found for: VITAMINB12 No results found for: TSH    ASSESSMENT AND PLAN  35 y.o. year old female  has a past medical history of Anxiety, Depression, and Head ache. here with   No diagnosis found.  Nailani continues to have regular migrainous headaches. She was seen for second opinion by [facility] and advised to restart Amovig. She has not yet done so and reports headaches responded favorably to Amovig in the past. I have encouraged her to restart Amovig as directed by [facility]. She will continue Nurtec for abortive therapy but was advised against regular use. Headache diary recommended. She will continue follow up with [facility] as directed. She has scheduled visit on 02/06/2020. She was encouraged to continue wrist brace. Healthy lifestyle habits recommended. She will continue close follow up with PCP and psychiatry as directed. Has appt with psychiatry tomorrow. She was advised that no further follow up needed with our office at this time. She verbalizes understanding and agreement with this plan.      Debbora Presto, MSN, FNP-C 08/09/2020, 4:11 PM  [facility] [address] [place],  [address] [phone]

## 2020-08-13 ENCOUNTER — Encounter: Payer: Self-pay | Admitting: Family Medicine

## 2020-08-13 ENCOUNTER — Ambulatory Visit: Payer: BC Managed Care – PPO | Admitting: Family Medicine

## 2020-08-13 DIAGNOSIS — G43019 Migraine without aura, intractable, without status migrainosus: Secondary | ICD-10-CM

## 2020-10-30 ENCOUNTER — Other Ambulatory Visit: Payer: Self-pay | Admitting: Neurology

## 2020-10-30 DIAGNOSIS — G43019 Migraine without aura, intractable, without status migrainosus: Secondary | ICD-10-CM

## 2020-10-31 ENCOUNTER — Telehealth: Payer: Self-pay | Admitting: *Deleted

## 2020-10-31 ENCOUNTER — Telehealth: Payer: Self-pay | Admitting: Neurology

## 2020-10-31 ENCOUNTER — Other Ambulatory Visit: Payer: Self-pay | Admitting: Family Medicine

## 2020-10-31 DIAGNOSIS — G43019 Migraine without aura, intractable, without status migrainosus: Secondary | ICD-10-CM

## 2020-10-31 NOTE — Telephone Encounter (Signed)
PA submitted through [employer] [id number] Will hear a response within 24 hrs

## 2020-10-31 NOTE — Telephone Encounter (Signed)
Error

## 2020-11-01 NOTE — Telephone Encounter (Signed)
PA approved 10/31/2020 - 10/31/2021. PA# [place] [phone] Non-Grandfathered [phone]

## 2020-11-05 DIAGNOSIS — Z8616 Personal history of COVID-19: Secondary | ICD-10-CM

## 2020-11-05 HISTORY — DX: Personal history of COVID-19: Z86.16

## 2020-12-06 HISTORY — PX: HEMORRHOID SURGERY: SHX153

## 2021-01-28 ENCOUNTER — Encounter: Payer: Self-pay | Admitting: Family Medicine

## 2021-01-28 ENCOUNTER — Ambulatory Visit: Payer: Self-pay | Admitting: Family Medicine

## 2021-01-28 NOTE — Progress Notes (Deleted)
No chief complaint on file.    HISTORY OF PRESENT ILLNESS: 01/28/21 ALL: Cheyenne Wilson returns for migraine follow up. She was seen by [facility] neurology for second opinion and advised to restart Amovig. She was last seen by me in 01/2020 and had not restarted medication and advised to continue follow up with [facility].   01/31/2020 ALL:  Cheyenne Wilson is a 35 y.o. female here today for follow up for migraines. She was seen by [facility] neurology who recommended she restart Amovig and Nurtec. 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  No cervical radiculopathy identified. She has follow up scheduled with [facility] neurology on 02/06/2020 for f/u. She reports seeing psychiatry and diagnosed with tactile hallucinations. She was started on fluoxetine and lamotrigine that has helped her sleep. She also takes clonazepam as needed. She reports crawling sensations have improved. She has not restarted Amovig. She does report significant reduction in headache intensity and frequency when taking Amovig. She has 12-15 headaches per month, most are migrainous. She has taken full pack of Nurtec every month. She is wearing a carpal tunnel brace that has helped.    HISTORY (copied from Dr Guadelupe Sabin note on 10/26/2019)  Cheyenne Wilson is a 35 year old right-handed woman with an underlying medical history of obesity, s/p gastric sleeve, RLS and migraine headaches, who Presents for follow-up consultation of her migraine headaches.  The patient is unaccompanied today.  I first met her on 07/28/2019, at which time she reported a longstanding history of migraine headaches.  She had moved from [place].  She was under the care of a neurologist.  She had a prescription for Ubrelvy.  She was advised to start Aimovig injections.  Her insurance did not cover Nurtec.   Today, 10/26/2019: She reports that her migraines are better with the Aimovig injections, she took her first dose on June 1 and her second dose  on July 1.  In the past month, she has had intermittent sensations of something crawling on or under her skin without any obvious rash, no urticaria, no actual itching.  No injection site reaction, no swelling of the lips or tongue.  She has seen ENT and dermatology.  She has not noticed any obvious connection to the Aimovig but that is the only recent medication but she also recently started phentermine.  She stopped the phentermine a few days ago and is due for her Aimovig injection on August 1.  She does believe it has helped her migraines.  She was not able to get the Nurtec prescription and has not filled her Roselyn Meier yet.     The patient's allergies, current medications, family history, past medical history, past social history, past surgical history and problem list were reviewed and updated as appropriate.    Previously:    07/28/19: (She) reports a longstanding history of migraine headaches.  She has no family history of migraines. She has recently moved from [place].  She has a left-sided throbbing headache, typically around the eye and behind the eye, sometimes in the back of her head.  She is currently on [place] which she does not feel has helped.  She is no longer taking Horizant, her primary care tried ropinirole for her restless legs which caused her to have nausea and vomiting.  She does not take gabapentin any longer.  She denies any visual aura, she denies any neurological accompaniments with her headache which can happen 2-3 times a week, may last 24 to 48 hours.  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  She is a non-smoker and drinks alcohol occasionally, once or twice a week, caffeine and limitation, 1 cup of coffee per day, she tries to hydrate well and sleeps fairly well, takes melatonin at night.   She has been on Imitrex and Maxalt, neither one helped, she took botox injections, which did not help, last injection on 05/13/2019.  She had  extensive evaluation through your office, all within the last year.  Upper extremity EMG nerve conduction velocity test on 01/14/2019 showed borderline carpal tunnel syndrome, lower extremity EMG nerve conduction test on 11/11/2018 showed no significant evidence of neuropathy, carotid Doppler ultrasound on 09/06/2018 showed no significant internal carotid artery stenosis on either side.  EEG on 09/07/2018 showed: Impression: This is a normal routine EEG study.  Transcranial Doppler ultrasound on 09/06/2018 was unremarkable, MRI brain without contrast normal findings, cognitive testing with neuro trax on 09/06/2018 showed no obvious abnormalities, extended 3-night EEG from 5/26 through 09/02/2018 showed no abnormalities, sleep study on 12/06/2018 was negative for sleep apnea. She has been on several preventative medications, including Topamax, and amitriptyline which did not help.  Gabapentin did not help.  She is interested in one of the newer injectable medications for headache prevention.    REVIEW OF SYSTEMS: Out of a complete 14 system review of symptoms, the patient complains only of the following symptoms, headaches, tingling and weakness of right hand, anxiety, depression and all other reviewed systems are negative.   ALLERGIES: Allergies  Allergen Reactions   Requip [Ropinirole] Nausea Only     HOME MEDICATIONS: Outpatient Medications Prior to Visit  Medication Sig Dispense Refill   clonazePAM (KLONOPIN) 1 MG tablet Take 1 mg by mouth at bedtime as needed.     Erenumab-aooe (AIMOVIG) 140 MG/ML SOAJ Inject 140 mg into the skin every 30 (thirty) days. (Patient not taking: Reported on 01/31/2020) 1 pen 5   FLUoxetine (PROZAC) 20 MG capsule Take 20 mg by mouth daily.     lamoTRIgine (LAMICTAL) 25 MG tablet Take by mouth.     Rimegepant Sulfate (NURTEC) 75 MG TBDP Take 1 tablet by mouth as needed (MAY REPEAT AFTER 2 HOURS, NO MORE THAN 2 PILLS IN 24 H). 8 tablet 4   traZODone (DESYREL) 100 MG tablet       No facility-administered medications prior to visit.     PAST MEDICAL HISTORY: Past Medical History:  Diagnosis Date   Anxiety    Depression    Head ache      PAST SURGICAL HISTORY: Past Surgical History:  Procedure Laterality Date   BREAST BIOPSY Left 2017   REDUCTION MAMMAPLASTY Bilateral      FAMILY HISTORY: Family History  Problem Relation Age of Onset   Breast cancer Mother 59   Hypertension Mother    Heart disease Father      SOCIAL HISTORY: Social History   Socioeconomic History   Marital status: Married    Spouse name: Not on file   Number of children: Not on file   Years of education: Not on file   Highest education level: Not on file  Occupational History   Not on file  Tobacco Use   Smoking status: Never   Smokeless tobacco: Never  Substance and Sexual Activity   Alcohol use: Yes    Comment: 1-2 drinks per week   Drug use: Never   Sexual activity: Not on file  Other Topics Concern   Not on file  Social History Narrative   Not on file   Social Determinants of Health   Financial Resource Strain: Not on file  Food Insecurity: Not on file  Transportation Needs: Not on file  Physical Activity: Not on file  Stress: Not on file  Social Connections: Not on file  Intimate Partner Violence: Not on file      PHYSICAL EXAM  There were no vitals filed for this visit.  There is no height or weight on file to calculate BMI.   Generalized: Well developed, in no acute distress   Neurological examination  Mentation: Alert oriented to time, place, history taking. Follows all commands speech and language fluent Cranial nerve II-XII: Pupils were equal round reactive to light. Extraocular movements were full, visual field were full  Motor: The motor testing reveals 5 over 5 strength of all 4 extremities. Good symmetric motor tone is noted throughout.  Gait and station: Gait is normal.     DIAGNOSTIC DATA (LABS, IMAGING, TESTING) - I  reviewed patient records, labs, notes, testing and imaging myself where available.  No results found for: WBC, HGB, HCT, MCV, PLT No results found for: NA, K, CL, CO2, GLUCOSE, BUN, CREATININE, CALCIUM, PROT, ALBUMIN, AST, ALT, ALKPHOS, BILITOT, GFRNONAA, GFRAA No results found for: CHOL, HDL, LDLCALC, LDLDIRECT, TRIG, CHOLHDL No results found for: HGBA1C No results found for: VITAMINB12 No results found for: TSH    ASSESSMENT AND PLAN  35 y.o. year old female  has a past medical history of Anxiety, Depression, and Head ache. here with   No diagnosis found.  Lajune continues to have regular migrainous headaches. She was seen for second opinion by [facility] and advised to restart Amovig. She has not yet done so and reports headaches responded favorably to Amovig in the past. I have encouraged her to restart Amovig as directed by [facility]. She will continue Nurtec for abortive therapy but was advised against regular use. Headache diary recommended. She will continue follow up with [facility] as directed. She has scheduled visit on 02/06/2020. She was encouraged to continue wrist brace. Healthy lifestyle habits recommended. She will continue close follow up with PCP and psychiatry as directed. Has appt with psychiatry tomorrow. She was advised that no further follow up needed with our office at this time. She verbalizes understanding and agreement with this plan.    Debbora Presto, MSN, FNP-C 01/28/2021, 7:30 AM  [facility] [address], [place], [place] [address] [phone]

## 2021-04-09 ENCOUNTER — Other Ambulatory Visit: Payer: Self-pay

## 2021-04-09 ENCOUNTER — Emergency Department (HOSPITAL_BASED_OUTPATIENT_CLINIC_OR_DEPARTMENT_OTHER)
Admission: EM | Admit: 2021-04-09 | Discharge: 2021-04-09 | Disposition: A | Discharge status: Home / Self Care | Payer: Medicaid Other | Attending: Emergency Medicine | Admitting: Emergency Medicine

## 2021-04-09 ENCOUNTER — Encounter (HOSPITAL_BASED_OUTPATIENT_CLINIC_OR_DEPARTMENT_OTHER): Payer: Self-pay

## 2021-04-09 DIAGNOSIS — N938 Other specified abnormal uterine and vaginal bleeding: Secondary | ICD-10-CM | POA: Insufficient documentation

## 2021-04-09 DIAGNOSIS — N939 Abnormal uterine and vaginal bleeding, unspecified: Secondary | ICD-10-CM | POA: Diagnosis present

## 2021-04-09 LAB — CBC
HCT: 33.5 % — ABNORMAL LOW (ref 36.0–46.0)
Hemoglobin: 11.5 g/dL — ABNORMAL LOW (ref 12.0–15.0)
MCH: 27.1 pg (ref 26.0–34.0)
MCHC: 34.3 g/dL (ref 30.0–36.0)
MCV: 78.8 fL — ABNORMAL LOW (ref 80.0–100.0)
Platelets: 296 10*3/uL (ref 150–400)
RBC: 4.25 MIL/uL (ref 3.87–5.11)
RDW: 16.5 % — ABNORMAL HIGH (ref 11.5–15.5)
WBC: 7.7 10*3/uL (ref 4.0–10.5)
nRBC: 0 % (ref 0.0–0.2)

## 2021-04-09 LAB — URINALYSIS, ROUTINE W REFLEX MICROSCOPIC
Bilirubin Urine: NEGATIVE
Glucose, UA: NEGATIVE mg/dL
Ketones, ur: 15 mg/dL — AB
Nitrite: NEGATIVE
Protein, ur: 100 mg/dL — AB
RBC / HPF: >50 RBC/hpf — ABNORMAL HIGH (ref 0–5)
Specific Gravity, Urine: 1.039 — ABNORMAL HIGH (ref 1.005–1.030)
pH: 5.5 (ref 5.0–8.0)

## 2021-04-09 LAB — COMPREHENSIVE METABOLIC PANEL
ALT: 11 U/L (ref 0–44)
AST: 14 U/L — ABNORMAL LOW (ref 15–41)
Albumin: 3.8 g/dL (ref 3.5–5.0)
Alkaline Phosphatase: 26 U/L — ABNORMAL LOW (ref 38–126)
Anion gap: 7 (ref 5–15)
BUN: 10 mg/dL (ref 6–20)
CO2: 26 mmol/L (ref 22–32)
Calcium: 8.8 mg/dL — ABNORMAL LOW (ref 8.9–10.3)
Chloride: 105 mmol/L (ref 98–111)
Creatinine, Ser: 0.89 mg/dL (ref 0.44–1.00)
GFR, Estimated: >60 mL/min (ref >60)
Glucose, Bld: 99 mg/dL (ref 70–99)
Potassium: 3.8 mmol/L (ref 3.5–5.1)
Sodium: 138 mmol/L (ref 135–145)
Total Bilirubin: 0.4 mg/dL (ref 0.3–1.2)
Total Protein: 6.8 g/dL (ref 6.5–8.1)

## 2021-04-09 LAB — PREGNANCY, URINE: Preg Test, Ur: NEGATIVE

## 2021-04-09 LAB — LIPASE, BLOOD: Lipase: 28 U/L (ref 11–51)

## 2021-04-09 MED ORDER — KETOROLAC TROMETHAMINE 15 MG/ML IJ SOLN
15.0000 mg | Freq: Once | INTRAMUSCULAR | Status: AC
Start: 2021-04-09 — End: 2021-04-09
  Administered 2021-04-09: 15 mg via INTRAMUSCULAR
  Filled 2021-04-09: qty 1

## 2021-04-09 NOTE — ED Provider Notes (Signed)
[facility] EMERGENCY DEPT Provider Note   CSN: [id number] Arrival date & time: 04/09/21  1858     History  Chief Complaint  Patient presents with   Vaginal Bleeding    Cheyenne Wilson is a 36 y.o. female.  36 yo F with a chief complaints of vaginal bleeding.  This is been going on for a few days now.  The patient typically has very regular cycles and has never had an issue where she is had prolonged bleeding.  She feels like she is filling up overnight pads at night.  She has been having episodes where she has been having bleeding over her normal amounts.  She has had some pelvic cramping with this.  Denies likelihood of being pregnant.  Denies trauma denies retained foreign body.  The history is provided by the patient.  Vaginal Bleeding Associated symptoms: no dizziness, no dysuria, no fever and no nausea   Illness Severity:  Moderate Onset quality:  Gradual Duration:  3 days Timing:  Constant Progression:  Worsening Chronicity:  New Associated symptoms: no chest pain, no congestion, no fever, no headaches, no myalgias, no nausea, no rhinorrhea, no shortness of breath, no vomiting and no wheezing       Home Medications Prior to Admission medications   Medication Sig Start Date End Date Taking? Authorizing Provider  clonazePAM (KLONOPIN) 1 MG tablet Take 1 mg by mouth at bedtime as needed. 01/15/20   [provider]  Erenumab-aooe (AIMOVIG) 140 MG/ML SOAJ Inject 140 mg into the skin every 30 (thirty) days. Patient not taking: Reported on 01/31/2020 07/28/19   Star Age, MD  FLUoxetine (PROZAC) 20 MG capsule Take 20 mg by mouth daily. 12/02/19   [provider]  lamoTRIgine (LAMICTAL) 25 MG tablet Take by mouth. 01/10/20   [provider]  Rimegepant Sulfate (NURTEC) 75 MG TBDP Take 1 tablet by mouth as needed (MAY REPEAT AFTER 2 HOURS, NO MORE THAN 2 PILLS IN 24 H). 10/31/20   Lomax, Amy, NP  traZODone (DESYREL) 100 MG  tablet  01/10/20   [provider]      Allergies    Requip [ropinirole]    Review of Systems   Review of Systems  Constitutional:  Negative for chills and fever.  HENT:  Negative for congestion and rhinorrhea.   Eyes:  Negative for redness and visual disturbance.  Respiratory:  Negative for shortness of breath and wheezing.   Cardiovascular:  Negative for chest pain and palpitations.  Gastrointestinal:  Negative for nausea and vomiting.  Genitourinary:  Positive for vaginal bleeding. Negative for dysuria and urgency.  Musculoskeletal:  Negative for arthralgias and myalgias.  Skin:  Negative for pallor and wound.  Neurological:  Negative for dizziness and headaches.   Physical Exam Updated Vital Signs BP 106/78    Pulse 65    Temp 98.2 F (36.8 C)    Resp 16    Ht 5\' 5"  (1.651 m)    Wt 91.6 kg    SpO2 100%    BMI 33.60 kg/m  Physical Exam Vitals and nursing note reviewed.  Constitutional:      General: She is not in acute distress.    Appearance: She is well-developed. She is not diaphoretic.  HENT:     Head: Normocephalic and atraumatic.  Eyes:     Pupils: Pupils are equal, round, and reactive to light.  Cardiovascular:     Rate and Rhythm: Normal rate and regular rhythm.     Heart  sounds: No murmur heard.   No friction rub. No gallop.  Pulmonary:     Effort: Pulmonary effort is normal.     Breath sounds: No wheezing or rales.  Abdominal:     General: There is no distension.     Palpations: Abdomen is soft.     Tenderness: There is no abdominal tenderness.  Genitourinary:    Comments: Slight oozing of blood at the os.  Some bright red blood in the vault. Musculoskeletal:        General: No tenderness.     Cervical back: Normal range of motion and neck supple.  Skin:    General: Skin is warm and dry.  Neurological:     Mental Status: She is alert and oriented to person, place, and time.  Psychiatric:        Behavior: Behavior normal.    ED Results /  Procedures / Treatments   Labs (all labs ordered are listed, but only abnormal results are displayed) Labs Reviewed  COMPREHENSIVE METABOLIC PANEL - Abnormal; Notable for the following components:      Result Value   Calcium 8.8 (*)    AST 14 (*)    Alkaline Phosphatase 26 (*)    All other components within normal limits  CBC - Abnormal; Notable for the following components:   Hemoglobin 11.5 (*)    HCT 33.5 (*)    MCV 78.8 (*)    RDW 16.5 (*)    All other components within normal limits  URINALYSIS, ROUTINE W REFLEX MICROSCOPIC - Abnormal; Notable for the following components:   APPearance CLOUDY (*)    Specific Gravity, Urine 1.039 (*)    Hgb urine dipstick LARGE (*)    Ketones, ur 15 (*)    Protein, ur 100 (*)    Leukocytes,Ua MODERATE (*)    RBC / HPF >50 (*)    All other components within normal limits  LIPASE, BLOOD  PREGNANCY, URINE    EKG None  Radiology No results found.  Procedures Procedures    Medications Ordered in ED Medications  ketorolac (TORADOL) 15 MG/ML injection 15 mg (15 mg Intramuscular Given 04/09/21 2246)    ED Course/ Medical Decision Making/ A&P                           Medical Decision Making  36 yo F with a chief complaint of dysfunctional uterine bleeding.  Patient is not pregnant here.  Been having heavy bleeding for about 5 days.  Mild anemia.  No significant electrolyte abnormality.  Exam without significant bleeding.  UA negative for infection.  We will have her follow-up with GYN in the office.  10:48 PM:  I have discussed the diagnosis/risks/treatment options with the patient and family and believe the pt to be eligible for discharge home to follow-up with GYN. We also discussed returning to the ED immediately if new or worsening sx occur. We discussed the sx which are most concerning (e.g., sudden worsening pain, fever, inability to tolerate by mouth, syncope) that necessitate immediate return. Medications administered to the  patient during their visit and any new prescriptions provided to the patient are listed below.  Medications given during this visit Medications  ketorolac (TORADOL) 15 MG/ML injection 15 mg (15 mg Intramuscular Given 04/09/21 2246)     The patient appears reasonably screen and/or stabilized for discharge and I doubt any other medical condition or other [facility] requiring further screening, evaluation, or  treatment in the ED at this time prior to discharge.           Final Clinical Impression(s) / ED Diagnoses Final diagnoses:  Dysfunctional uterine bleeding    Rx / DC Orders ED Discharge Orders     None         Deno Etienne, DO 04/09/21 2248

## 2021-04-09 NOTE — Discharge Instructions (Signed)
Take 4 over the counter ibuprofen tablets 3 times a day or 2 over-the-counter naproxen tablets twice a day for pain. Also take tylenol 1000mg(2 extra strength) four times a day.    

## 2021-04-09 NOTE — ED Triage Notes (Signed)
Patient here POV from Home with Vaginal Bleeding.  LMP ended 03/24/21 and then Patient began experiencing abnormal vaginal bleeding on 04/03/21.  Patient states she is also experiencing Right Sided Pelvic Pain. No Urinary Symptoms. Mild to moderate nausea.  NAD Noted during Triage. A&Ox4. GCS 15. Ambulatory.

## 2021-04-23 ENCOUNTER — Emergency Department (HOSPITAL_COMMUNITY)
Admission: EM | Admit: 2021-04-23 | Discharge: 2021-04-24 | Disposition: A | Discharge status: Home / Self Care | Payer: Medicaid Other | Attending: Emergency Medicine | Admitting: Emergency Medicine

## 2021-04-23 ENCOUNTER — Encounter (HOSPITAL_COMMUNITY): Payer: Self-pay

## 2021-04-23 ENCOUNTER — Other Ambulatory Visit: Payer: Self-pay

## 2021-04-23 DIAGNOSIS — N8500 Endometrial hyperplasia, unspecified: Secondary | ICD-10-CM

## 2021-04-23 DIAGNOSIS — D219 Benign neoplasm of connective and other soft tissue, unspecified: Secondary | ICD-10-CM

## 2021-04-23 DIAGNOSIS — N939 Abnormal uterine and vaginal bleeding, unspecified: Secondary | ICD-10-CM | POA: Diagnosis present

## 2021-04-23 LAB — CBC WITH DIFFERENTIAL/PLATELET
Abs Immature Granulocytes: 0.01 10*3/uL (ref 0.00–0.07)
Basophils Absolute: 0.1 10*3/uL (ref 0.0–0.1)
Basophils Relative: 1 %
Eosinophils Absolute: 0.1 10*3/uL (ref 0.0–0.5)
Eosinophils Relative: 1 %
HCT: 30.4 % — ABNORMAL LOW (ref 36.0–46.0)
Hemoglobin: 10.6 g/dL — ABNORMAL LOW (ref 12.0–15.0)
Immature Granulocytes: 0 %
Lymphocytes Relative: 39 %
Lymphs Abs: 3.1 10*3/uL (ref 0.7–4.0)
MCH: 27.7 pg (ref 26.0–34.0)
MCHC: 34.9 g/dL (ref 30.0–36.0)
MCV: 79.6 fL — ABNORMAL LOW (ref 80.0–100.0)
Monocytes Absolute: 0.5 10*3/uL (ref 0.1–1.0)
Monocytes Relative: 6 %
Neutro Abs: 4.3 10*3/uL (ref 1.7–7.7)
Neutrophils Relative %: 53 %
Platelets: 375 10*3/uL (ref 150–400)
RBC: 3.82 MIL/uL — ABNORMAL LOW (ref 3.87–5.11)
RDW: 15.5 % (ref 11.5–15.5)
WBC: 8.1 10*3/uL (ref 4.0–10.5)
nRBC: 0 % (ref 0.0–0.2)

## 2021-04-23 LAB — TYPE AND SCREEN
ABO/RH(D): A POS
Antibody Screen: NEGATIVE

## 2021-04-23 LAB — I-STAT BETA HCG BLOOD, ED (MC, WL, AP ONLY): I-stat hCG, quantitative: <5 m[IU]/mL (ref <5)

## 2021-04-23 LAB — BASIC METABOLIC PANEL
Anion gap: 7 (ref 5–15)
BUN: 15 mg/dL (ref 6–20)
CO2: 25 mmol/L (ref 22–32)
Calcium: 8.8 mg/dL — ABNORMAL LOW (ref 8.9–10.3)
Chloride: 105 mmol/L (ref 98–111)
Creatinine, Ser: 0.93 mg/dL (ref 0.44–1.00)
GFR, Estimated: >60 mL/min (ref >60)
Glucose, Bld: 114 mg/dL — ABNORMAL HIGH (ref 70–99)
Potassium: 3.5 mmol/L (ref 3.5–5.1)
Sodium: 137 mmol/L (ref 135–145)

## 2021-04-23 NOTE — ED Triage Notes (Signed)
Pt states that she has had heavy vaginal bleeding since December 13. Pt states that she is following up with her OBGYN but her appt is weeks away and she feels that her symptoms are getting worse.

## 2021-04-23 NOTE — ED Provider Triage Note (Signed)
Emergency Medicine Provider Triage Evaluation Note  Cheyenne Wilson , a 36 y.o. female  was evaluated in triage.  Pt complains of vaginal bleeding for about 2 weeks now.  Patient was seen in the ER on 1/03 for same.  She had blood work done and was discharged home with plans to follow-up with OB/GYN.  She states that she saw OB/GYN on Friday.  She started taking OCPs 2 days ago for tapering however states that she continues to bleed and passing heavy clots.  She states she is now feeling lightheaded and dizzy.  She denies chest pain or shortness of breath.  Review of Systems  Positive: + vaginal bleeding, lightheaded, dizzy Negative: - chest pain, SOB  Physical Exam  BP (!) 150/98 (BP Location: Left Arm)    Pulse 79    Temp 98.4 F (36.9 C) (Oral)    Resp 16    Ht 5\' 5"  (1.651 m)    Wt 98 kg    SpO2 100%    BMI 35.94 kg/m  Gen:   Awake, no distress   Resp:  Normal effort  MSK:   Moves extremities without difficulty  Other:    Medical Decision Making  Medically screening exam initiated at 9:26 PM.  Appropriate orders placed.  Cheyenne Wilson was informed that the remainder of the evaluation will be completed by another provider, this initial triage assessment does not replace that evaluation, and the importance of remaining in the ED until their evaluation is complete.     Eustaquio Maize, PA-C 04/23/21 2127

## 2021-04-24 ENCOUNTER — Emergency Department (HOSPITAL_COMMUNITY): Payer: Medicaid Other

## 2021-04-24 NOTE — Discharge Instructions (Addendum)
Continue with medication as prescribed by your doctor.  Follow-up with your gynecologist.  Return to the emergency room for worsening or concerning symptoms.  Continue with your iron supplement.

## 2021-04-24 NOTE — ED Notes (Signed)
Pt left emergency department prior to this writer going over discharge papers.

## 2021-04-24 NOTE — ED Provider Notes (Signed)
[facility] DEPT Provider Note   CSN: [id number] Arrival date & time: 04/23/21  1929     History  Chief Complaint  Patient presents with   Vaginal Bleeding    Cheyenne Wilson is a 36 y.o. female.  36 year old female with history of anxiety and depression presents with complaint of heavy vaginal bleeding.  Reports having her menstrual cycle December 13 through 18 followed by a few days without bleeding.  Since that time, has had daily bleeding with passage of large clots.  Reports bleeding through heavy pads regularly.  Patient was seen by her gynecologist on Friday of last week, had a pelvic exam and was told her uterus felt a little enlarged and is scheduled for an ultrasound at the end of February.  Patient called her doctor back to report heavier bleeding and was started on a birth control pill 2 days ago.  States that she is feeling generally weak.  Patient is on an iron supplement.  No other complaints or concerns.      Home Medications Prior to Admission medications   Medication Sig Start Date End Date Taking? Authorizing Provider  clonazePAM (KLONOPIN) 1 MG tablet Take 1 mg by mouth at bedtime as needed. 01/15/20   [provider]  Erenumab-aooe (AIMOVIG) 140 MG/ML SOAJ Inject 140 mg into the skin every 30 (thirty) days. Patient not taking: Reported on 01/31/2020 07/28/19   Star Age, MD  FLUoxetine (PROZAC) 20 MG capsule Take 20 mg by mouth daily. 12/02/19   [provider]  lamoTRIgine (LAMICTAL) 25 MG tablet Take by mouth. 01/10/20   [provider]  Rimegepant Sulfate (NURTEC) 75 MG TBDP Take 1 tablet by mouth as needed (MAY REPEAT AFTER 2 HOURS, NO MORE THAN 2 PILLS IN 24 H). 10/31/20   Lomax, Amy, NP  traZODone (DESYREL) 100 MG tablet  01/10/20   [provider]      Allergies    Requip [ropinirole]    Review of Systems   Review of Systems  Constitutional:  Positive for fatigue. Negative  for chills and fever.  Respiratory:  Negative for shortness of breath.   Cardiovascular:  Negative for chest pain.  Gastrointestinal:  Negative for abdominal pain, nausea and vomiting.  Genitourinary:  Positive for pelvic pain and vaginal bleeding.  Musculoskeletal:  Negative for back pain and myalgias.  Skin:  Negative for rash and wound.  Allergic/Immunologic: Negative for immunocompromised state.  Neurological:  Positive for weakness.  Hematological:  Negative for adenopathy.  Psychiatric/Behavioral:  Negative for confusion.   All other systems reviewed and are negative.  Physical Exam Updated Vital Signs BP 129/86    Pulse 80    Temp 98.4 F (36.9 C) (Oral)    Resp 16    Ht 5\' 5"  (1.651 m)    Wt 98 kg    SpO2 100%    BMI 35.94 kg/m  Physical Exam Vitals and nursing note reviewed.  Constitutional:      General: She is not in acute distress.    Appearance: She is obese. She is not ill-appearing.  HENT:     Head: Normocephalic and atraumatic.  Eyes:     Conjunctiva/sclera: Conjunctivae normal.  Cardiovascular:     Rate and Rhythm: Normal rate and regular rhythm.     Heart sounds: Normal heart sounds.  Pulmonary:     Effort: Pulmonary effort is normal.     Breath sounds: Normal breath sounds.  Abdominal:     Palpations:  Abdomen is soft.     Tenderness: There is abdominal tenderness.     Comments: Mild lower abdominal tenderness  Musculoskeletal:     Cervical back: Neck supple.  Skin:    General: Skin is warm and dry.     Findings: No erythema or rash.  Neurological:     Mental Status: She is alert and oriented to person, place, and time.  Psychiatric:        Behavior: Behavior normal.    ED Results / Procedures / Treatments   Labs (all labs ordered are listed, but only abnormal results are displayed) Labs Reviewed  BASIC METABOLIC PANEL - Abnormal; Notable for the following components:      Result Value   Glucose, Bld 114 (*)    Calcium 8.8 (*)    All other  components within normal limits  CBC WITH DIFFERENTIAL/PLATELET - Abnormal; Notable for the following components:   RBC 3.82 (*)    Hemoglobin 10.6 (*)    HCT 30.4 (*)    MCV 79.6 (*)    All other components within normal limits  I-STAT BETA HCG BLOOD, ED ([facility], [facility], [facility] ONLY)  TYPE AND SCREEN    EKG None  Radiology US Transvaginal Non-OB  Result Date: 04/24/2021 CLINICAL DATA:  Heavy bleeding, pelvic pain EXAM: TRANSABDOMINAL AND TRANSVAGINAL ULTRASOUND OF PELVIS DOPPLER ULTRASOUND OF OVARIES TECHNIQUE: Both transabdominal and transvaginal ultrasound examinations of the pelvis were performed. Transabdominal technique was performed for global imaging of the pelvis including uterus, ovaries, adnexal regions, and pelvic cul-de-sac. It was necessary to proceed with endovaginal exam following the transabdominal exam to visualize the uterus, endometrium, ovaries, and adnexa. Color and duplex Doppler ultrasound was utilized to evaluate blood flow to the ovaries. COMPARISON:  None. FINDINGS: Uterus Measurements: 10.1 x 6.4 x 6.8 cm = volume: 231 mL. 1.0 cm subserosal fibroid of the right aspect of the anterior uterine fundus. Nabothian cysts of the cervix. Endometrium Thickness: 27 mm.  No focal abnormality visualized. Right ovary Measurements: 3.5 x 1.5 x 1.2 cm = volume: 3 mL. Normal appearance/no adnexal mass. Multiple small follicles. Left ovary Measurements: 3.3 x 2.4 x 2.3 cm = volume: 9 mL. Normal appearance/no adnexal mass. Multiple small follicles Pulsed Doppler evaluation of both ovaries demonstrates normal low-resistance arterial and venous waveforms. Other findings No abnormal free fluid. IMPRESSION: 1. The endometrium is abnormally thickened measuring 27 mm, generally greater than expected even for secretory endometrium in the reproductive age setting. Given report of abnormal bleeding, consider tissue sampling to exclude endometrial hyperplasia or malignancy. 2. Incidental subserosal fibroid of  the anterior uterine fundus. No other fibroids or submucosal component. Electronically Signed   By: Delanna Ahmadi M.D.   On: 04/24/2021 08:53   US Pelvis Complete  Result Date: 04/24/2021 CLINICAL DATA:  Heavy bleeding, pelvic pain EXAM: TRANSABDOMINAL AND TRANSVAGINAL ULTRASOUND OF PELVIS DOPPLER ULTRASOUND OF OVARIES TECHNIQUE: Both transabdominal and transvaginal ultrasound examinations of the pelvis were performed. Transabdominal technique was performed for global imaging of the pelvis including uterus, ovaries, adnexal regions, and pelvic cul-de-sac. It was necessary to proceed with endovaginal exam following the transabdominal exam to visualize the uterus, endometrium, ovaries, and adnexa. Color and duplex Doppler ultrasound was utilized to evaluate blood flow to the ovaries. COMPARISON:  None. FINDINGS: Uterus Measurements: 10.1 x 6.4 x 6.8 cm = volume: 231 mL. 1.0 cm subserosal fibroid of the right aspect of the anterior uterine fundus. Nabothian cysts of the cervix. Endometrium Thickness: 27 mm.  No focal  abnormality visualized. Right ovary Measurements: 3.5 x 1.5 x 1.2 cm = volume: 3 mL. Normal appearance/no adnexal mass. Multiple small follicles. Left ovary Measurements: 3.3 x 2.4 x 2.3 cm = volume: 9 mL. Normal appearance/no adnexal mass. Multiple small follicles Pulsed Doppler evaluation of both ovaries demonstrates normal low-resistance arterial and venous waveforms. Other findings No abnormal free fluid. IMPRESSION: 1. The endometrium is abnormally thickened measuring 27 mm, generally greater than expected even for secretory endometrium in the reproductive age setting. Given report of abnormal bleeding, consider tissue sampling to exclude endometrial hyperplasia or malignancy. 2. Incidental subserosal fibroid of the anterior uterine fundus. No other fibroids or submucosal component. Electronically Signed   By: Delanna Ahmadi M.D.   On: 04/24/2021 08:53    Procedures Procedures    Medications  Ordered in ED Medications - No data to display  ED Course/ Medical Decision Making/ A&P Clinical Course as of 04/24/21 1511  Wed Apr 24, 6644  6855 36 year old female presents with concern for heavy vaginal bleeding as above.  No improvement with 2 days of OCP as prescribed by her gynecologist. On exam has mild lower abdominal tenderness without guarding or rebound.  Labs reviewed, CBC with slight drop in hemoglobin from 11.52 weeks ago to 10.6 today.  BMP is unremarkable, hCG negative.  Vitals are stable, does not require emergent transfusion in the emergency room today.  Did just have pelvic exam with her gynecologist last week, exam deferred at this time. Pelvic ultrasound obtained today showing endometrium thickened to 27 mm.  I discussed results with patient, results were printed and handed to patient to take to gynecology for follow-up.  Encouraged to continue the OCPs as prescribed by her PCP.  Return to ED for any worsening or concerning symptoms. [LM]    Clinical Course User Index [LM] Tacy Learn, PA-C                           Medical Decision Making Amount and/or Complexity of Data Reviewed Radiology: ordered.         Final Clinical Impression(s) / ED Diagnoses Final diagnoses:  Vaginal bleeding  Endometrial hyperplasia  Fibroid    Rx / DC Orders ED Discharge Orders     None         Tacy Learn, PA-C 04/24/21 1511    Daleen Bo, MD 04/24/21 1737

## 2021-07-03 ENCOUNTER — Encounter (HOSPITAL_BASED_OUTPATIENT_CLINIC_OR_DEPARTMENT_OTHER): Payer: Self-pay | Admitting: Obstetrics

## 2021-07-03 ENCOUNTER — Other Ambulatory Visit: Payer: Self-pay

## 2021-07-03 NOTE — Progress Notes (Signed)
Spoke w/ via phone for pre-op interview--- pt ?Lab needs dos----   cbc, urine preg            ?Lab results------ no ?COVID test -----patient states asymptomatic no test needed ?Arrive at ------- 1000 on 07-08-2021 ?NPO after MN NO Solid Food.  Clear liquids from MN until--- 0900 ?Med rec completed ?Medications to take morning of surgery ----- prilosec, megace, if needed may take nurtec ?Diabetic medication ----- n/a ?Patient instructed no nail polish to be worn day of surgery ?Patient instructed to bring photo id and insurance card day of surgery ?Patient aware to have Driver (ride ) / caregiver for 24 hours after surgery --friend, naphalie mooving ?Patient Special Instructions ----- n/a ?Pre-Op special Istructions ----- n/a ?Patient verbalized understanding of instructions that were given at this phone interview. ?Patient denies shortness of breath, chest pain, fever, cough at this phone interview.  ?

## 2021-07-08 ENCOUNTER — Encounter (HOSPITAL_BASED_OUTPATIENT_CLINIC_OR_DEPARTMENT_OTHER)
Admission: RE | Disposition: A | Discharge status: Home / Self Care | Payer: Self-pay | Source: Home / Self Care | Attending: Obstetrics

## 2021-07-08 ENCOUNTER — Encounter (HOSPITAL_BASED_OUTPATIENT_CLINIC_OR_DEPARTMENT_OTHER): Payer: Self-pay | Admitting: Obstetrics

## 2021-07-08 ENCOUNTER — Ambulatory Visit (HOSPITAL_BASED_OUTPATIENT_CLINIC_OR_DEPARTMENT_OTHER)
Admission: RE | Admit: 2021-07-08 | Discharge: 2021-07-08 | Disposition: A | Discharge status: Home / Self Care | Payer: Medicaid Other | Attending: Obstetrics | Admitting: Obstetrics

## 2021-07-08 ENCOUNTER — Ambulatory Visit (HOSPITAL_BASED_OUTPATIENT_CLINIC_OR_DEPARTMENT_OTHER): Payer: Medicaid Other | Admitting: Anesthesiology

## 2021-07-08 ENCOUNTER — Other Ambulatory Visit: Payer: Self-pay

## 2021-07-08 DIAGNOSIS — F419 Anxiety disorder, unspecified: Secondary | ICD-10-CM | POA: Diagnosis not present

## 2021-07-08 DIAGNOSIS — Z9884 Bariatric surgery status: Secondary | ICD-10-CM | POA: Diagnosis not present

## 2021-07-08 DIAGNOSIS — N939 Abnormal uterine and vaginal bleeding, unspecified: Secondary | ICD-10-CM | POA: Diagnosis present

## 2021-07-08 DIAGNOSIS — Z6835 Body mass index (BMI) 35.0-35.9, adult: Secondary | ICD-10-CM | POA: Diagnosis not present

## 2021-07-08 DIAGNOSIS — D250 Submucous leiomyoma of uterus: Secondary | ICD-10-CM

## 2021-07-08 DIAGNOSIS — E669 Obesity, unspecified: Secondary | ICD-10-CM | POA: Diagnosis not present

## 2021-07-08 DIAGNOSIS — Z8616 Personal history of COVID-19: Secondary | ICD-10-CM | POA: Diagnosis not present

## 2021-07-08 DIAGNOSIS — K219 Gastro-esophageal reflux disease without esophagitis: Secondary | ICD-10-CM | POA: Insufficient documentation

## 2021-07-08 DIAGNOSIS — N907 Vulvar cyst: Secondary | ICD-10-CM

## 2021-07-08 DIAGNOSIS — F32A Depression, unspecified: Secondary | ICD-10-CM | POA: Diagnosis not present

## 2021-07-08 HISTORY — DX: Abnormal uterine and vaginal bleeding, unspecified: N93.9

## 2021-07-08 HISTORY — DX: Iron deficiency anemia, unspecified: D50.9

## 2021-07-08 HISTORY — DX: Migraine without aura, not intractable, without status migrainosus: G43.009

## 2021-07-08 HISTORY — PX: DILATATION & CURETTAGE/HYSTEROSCOPY WITH MYOSURE: SHX6511

## 2021-07-08 HISTORY — DX: Gastro-esophageal reflux disease without esophagitis: K21.9

## 2021-07-08 LAB — CBC
HCT: 27.6 % — ABNORMAL LOW (ref 36.0–46.0)
Hemoglobin: 8.9 g/dL — ABNORMAL LOW (ref 12.0–15.0)
MCH: 21.9 pg — ABNORMAL LOW (ref 26.0–34.0)
MCHC: 32.2 g/dL (ref 30.0–36.0)
MCV: 67.8 fL — ABNORMAL LOW (ref 80.0–100.0)
Platelets: 396 10*3/uL (ref 150–400)
RBC: 4.07 MIL/uL (ref 3.87–5.11)
RDW: 18.4 % — ABNORMAL HIGH (ref 11.5–15.5)
WBC: 4 10*3/uL (ref 4.0–10.5)
nRBC: 0 % (ref 0.0–0.2)

## 2021-07-08 LAB — POCT PREGNANCY, URINE: Preg Test, Ur: NEGATIVE

## 2021-07-08 SURGERY — DILATATION & CURETTAGE/HYSTEROSCOPY WITH MYOSURE
Anesthesia: General | Site: Uterus

## 2021-07-08 MED ORDER — OXYCODONE HCL 5 MG PO TABS
5.0000 mg | ORAL_TABLET | Freq: Once | ORAL | Status: AC | PRN
  Administered 2021-07-08: 5 mg via ORAL

## 2021-07-08 MED ORDER — DEXAMETHASONE SODIUM PHOSPHATE 4 MG/ML IJ SOLN
INTRAMUSCULAR | Status: DC | PRN
  Administered 2021-07-08: 8 mg via INTRAVENOUS

## 2021-07-08 MED ORDER — LIDOCAINE HCL 1 % IJ SOLN
INTRAMUSCULAR | Status: AC
  Filled 2021-07-08: qty 20

## 2021-07-08 MED ORDER — GLYCOPYRROLATE 0.2 MG/ML IJ SOLN
INTRAMUSCULAR | Status: DC | PRN
  Administered 2021-07-08: .1 mg via INTRAVENOUS

## 2021-07-08 MED ORDER — OXYCODONE HCL 5 MG/5ML PO SOLN: 5.0000 mg | Freq: Once | ORAL | Status: AC | PRN

## 2021-07-08 MED ORDER — FENTANYL CITRATE (PF) 100 MCG/2ML IJ SOLN
INTRAMUSCULAR | Status: DC | PRN
Start: 2021-07-08 — End: 2021-07-08
  Administered 2021-07-08 (×2): 25 ug via INTRAVENOUS
  Administered 2021-07-08: 50 ug via INTRAVENOUS

## 2021-07-08 MED ORDER — FENTANYL CITRATE (PF) 100 MCG/2ML IJ SOLN: 25.0000 ug | INTRAMUSCULAR | Status: DC | PRN

## 2021-07-08 MED ORDER — SODIUM CHLORIDE 0.9 % IR SOLN
Status: DC | PRN
  Administered 2021-07-08: 3000 mL

## 2021-07-08 MED ORDER — OXYCODONE HCL 5 MG PO TABS
ORAL_TABLET | ORAL | Status: AC
  Filled 2021-07-08: qty 1

## 2021-07-08 MED ORDER — ONDANSETRON HCL 4 MG/2ML IJ SOLN
INTRAMUSCULAR | Status: AC
  Filled 2021-07-08: qty 2

## 2021-07-08 MED ORDER — ONDANSETRON HCL 4 MG/2ML IJ SOLN: 4.0000 mg | Freq: Once | INTRAMUSCULAR | Status: DC | PRN

## 2021-07-08 MED ORDER — PROPOFOL 10 MG/ML IV BOLUS
INTRAVENOUS | Status: DC | PRN
Start: 2021-07-08 — End: 2021-07-08
  Administered 2021-07-08: 100 mg via INTRAVENOUS
  Administered 2021-07-08: 200 mg via INTRAVENOUS

## 2021-07-08 MED ORDER — SILVER NITRATE-POT NITRATE 75-25 % EX MISC
CUTANEOUS | Status: AC
  Filled 2021-07-08: qty 10

## 2021-07-08 MED ORDER — FENTANYL CITRATE (PF) 100 MCG/2ML IJ SOLN
INTRAMUSCULAR | Status: AC
  Filled 2021-07-08: qty 2

## 2021-07-08 MED ORDER — GLYCOPYRROLATE PF 0.2 MG/ML IJ SOSY
PREFILLED_SYRINGE | INTRAMUSCULAR | Status: AC
  Filled 2021-07-08: qty 3

## 2021-07-08 MED ORDER — ONDANSETRON HCL 4 MG/2ML IJ SOLN
INTRAMUSCULAR | Status: DC | PRN
  Administered 2021-07-08: 4 mg via INTRAVENOUS

## 2021-07-08 MED ORDER — MIDAZOLAM HCL 2 MG/2ML IJ SOLN
INTRAMUSCULAR | Status: AC
  Filled 2021-07-08: qty 2

## 2021-07-08 MED ORDER — DROPERIDOL 2.5 MG/ML IJ SOLN: 0.6250 mg | Freq: Once | INTRAMUSCULAR | Status: DC | PRN

## 2021-07-08 MED ORDER — KETOROLAC TROMETHAMINE 30 MG/ML IJ SOLN
INTRAMUSCULAR | Status: AC
  Filled 2021-07-08: qty 2

## 2021-07-08 MED ORDER — LIDOCAINE HCL 1 % IJ SOLN
INTRAMUSCULAR | Status: DC | PRN
  Administered 2021-07-08: 15 mL

## 2021-07-08 MED ORDER — LACTATED RINGERS IV SOLN: INTRAVENOUS | Status: DC

## 2021-07-08 MED ORDER — MIDAZOLAM HCL 2 MG/2ML IJ SOLN
INTRAMUSCULAR | Status: DC | PRN
  Administered 2021-07-08: 2 mg via INTRAVENOUS

## 2021-07-08 MED ORDER — KETOROLAC TROMETHAMINE 30 MG/ML IJ SOLN
INTRAMUSCULAR | Status: DC | PRN
Start: 2021-07-08 — End: 2021-07-08
  Administered 2021-07-08: 30 mg via INTRAVENOUS

## 2021-07-08 SURGICAL SUPPLY — 19 items
CATH ROBINSON RED A/P 16FR (CATHETERS) ×1 IMPLANT
DEVICE MYOSURE LITE (MISCELLANEOUS) IMPLANT
DEVICE MYOSURE REACH (MISCELLANEOUS) ×1 IMPLANT
DRSG TELFA 3X8 NADH (GAUZE/BANDAGES/DRESSINGS) ×2 IMPLANT
GAUZE 4X4 16PLY ~~LOC~~+RFID DBL (SPONGE) ×4 IMPLANT
GLOVE SURG LTX SZ6 (GLOVE) ×2 IMPLANT
GLOVE SURG UNDER POLY LF SZ6 (GLOVE) ×1 IMPLANT
GOWN STRL REUS W/TWL LRG LVL3 (GOWN DISPOSABLE) ×2 IMPLANT
IV NS IRRIG 3000ML ARTHROMATIC (IV SOLUTION) ×2 IMPLANT
KIT PROCEDURE FLUENT (KITS) ×2 IMPLANT
KIT TURNOVER CYSTO (KITS) ×2 IMPLANT
PACK VAGINAL MINOR WOMEN LF (CUSTOM PROCEDURE TRAY) ×2 IMPLANT
PAD DRESSING TELFA 3X8 NADH (GAUZE/BANDAGES/DRESSINGS) ×1 IMPLANT
PAD OB MATERNITY 4.3X12.25 (PERSONAL CARE ITEMS) ×2 IMPLANT
PUNCH BIOPSY DERMAL 4MM (INSTRUMENTS) ×1 IMPLANT
SEAL CERVICAL OMNI LOK (ABLATOR) IMPLANT
SEAL ROD LENS SCOPE MYOSURE (ABLATOR) ×2 IMPLANT
TOWEL OR 17X26 10 PK STRL BLUE (TOWEL DISPOSABLE) ×2 IMPLANT
WATER STERILE IRR 500ML POUR (IV SOLUTION) ×2 IMPLANT

## 2021-07-08 NOTE — Anesthesia Postprocedure Evaluation (Signed)
Anesthesia Post Note ? ?Patient: Cheyenne Wilson. [place] ? ?Procedure(s) Performed: DILATATION & CURETTAGE/HYSTEROSCOPY, myomectomy,WITH MYOSURE, excision of vulvar lesions (Uterus) ? ?  ? ?Patient location during evaluation: Phase II ?Anesthesia Type: General ?Level of consciousness: awake and alert and oriented ?Pain management: pain level controlled ?Vital Signs Assessment: post-procedure vital signs reviewed and stable ?Respiratory status: spontaneous breathing, nonlabored ventilation and respiratory function stable ?Cardiovascular status: blood pressure returned to baseline and stable ?Postop Assessment: no apparent nausea or vomiting ?Anesthetic complications: no ? ? ?No notable events documented. ? ?Last Vitals:  ?Vitals:  ? 07/08/21 1300 07/08/21 1325  ?BP: 124/76 125/76  ?Pulse: (!) 53 68  ?Resp: 16 16  ?Temp:  36.5 ?C  ?SpO2: 100% 100%  ?  ?Last Pain:  ?Vitals:  ? 07/08/21 1325  ?TempSrc: Oral  ?PainSc:   ? ? ?  ?  ?  ?  ?  ?  ? ?Shmuel Girgis A. ? ? ? ? ?

## 2021-07-08 NOTE — H&P (Signed)
36 y.o. No obstetric history on file. presents for hysteroscopy, D&C for abnormal uterine bleeding.    She has a history of regular monthly menses. She started with heavy bleeding and clots at the end of December.  She presented as a new patient in January 2023.  A cervical polyp was seen and removed at that time.  Ultrasound performed in the emergency removed revealed an endometrial stripe of 2.6 cm.  There was a small subserosal fibroid.  She had an endometrial biopsy that was normal.  She failed to achieve amenorrhea with an ocp taper.  She was transitioned to provera which stopped the bleeding mid-February, but it returned early March, heavy with clots.  She was transitioned to BID megace.  This helped to lessen but not stop the flow.  She is frustrated by continued bleeding.  She likely desires future fertility.  ? ?She also has multiple vulvar inclusions cysts.  She would like removal today while in OR ? ? ? ?Past Medical History:  ?Diagnosis Date  ? Abnormal uterine bleeding (AUB)   ? Anxiety   ? Depression   ? GERD (gastroesophageal reflux disease)   ? History of COVID-19 11/2020  ? per pt asymptomatic  ? IDA (iron deficiency anemia)   ? Migraine without aura and without status migrainosus, not intractable   ? neurologist-- dr m. Aundra Dubin ([facility] in [place])  ? S/P gastric sleeve procedure 2016  ?  ?Past Surgical History:  ?Procedure Laterality Date  ? BREAST REDUCTION SURGERY Bilateral 2002  ? HEMORRHOID SURGERY  12/2020  ? LAPAROSCOPIC GASTRIC SLEEVE RESECTION  2016  ?  ?OB History  ?No obstetric history on file.  ?  ?Social History  ? ?Socioeconomic History  ? Marital status: Married  ?  Spouse name: Not on file  ? Number of children: Not on file  ? Years of education: Not on file  ? Highest education level: Not on file  ?Occupational History  ? Not on file  ?Tobacco Use  ? Smoking status: Never  ? Smokeless tobacco: Never  ?Vaping Use  ? Vaping Use: Never used  ?Substance and Sexual Activity  ? Alcohol use: Yes   ?  Comment: socially  ? Drug use: Never  ? Sexual activity: Not on file  ?Other Topics Concern  ? Not on file  ?Social History Narrative  ? Not on file  ? ?Social Determinants of Health  ? ?Financial Resource Strain: Not on file  ?Food Insecurity: Not on file  ?Transportation Needs: Not on file  ?Physical Activity: Not on file  ?Stress: Not on file  ?Social Connections: Not on file  ?Intimate Partner Violence: Not on file  ? Requip [ropinirole]  ? ? ?Vitals:  ? 07/08/21 0933  ?BP: (!) 144/94  ?Pulse: 68  ?Resp: 16  ?Temp: 98.3 ?F (36.8 ?C)  ?SpO2: 100%  ?  ? ?General:  NAD ?Abdomen:  soft ?Vulvar:  Numerous scatter vulvar inclusions cysts on bilateral labia minora ? ? ? ?A/P   36 y.o.  [id number] presents for hysteroscopy, dilation and curettage for abnormal uterine bleeding.  ?Negative EMB, but almost daily and heavy bleeding x 3 months.  Thickened endometrial at time of ultrasound warrants more complete evaluation.   If a polyp is identified as the cause of abnormal uterine bleeding, will plan removal.   ? ?Multiple bilateral vulvar inclusion cysts, too numerous to remove all. Will plan removal of largest 4-6.  Discussed risks to include infection, bleeding, damage to surrounding structures (including  but not limited to vagina, cervix, bladder, uterus), uterine perforation, in ability to fully resect polyp or possible recurrence, need for additional procedures.  All questions answered and patient elects to proceed.  ? ?[place] ? ?

## 2021-07-08 NOTE — Transfer of Care (Signed)
Immediate Anesthesia Transfer of Care Note ? ?Patient: Cheyenne Wilson. Anderson-Hopkins ? ?Procedure(s) Performed: DILATATION & CURETTAGE/HYSTEROSCOPY, myomectomy,WITH MYOSURE, excision of vulvar lesions (Uterus) ? ?Patient Location: PACU ? ?Anesthesia Type:General ? ?Level of Consciousness: awake and patient cooperative ? ?Airway & Oxygen Therapy: Patient Spontanous Breathing and Patient connected to nasal cannula oxygen ? ?Post-op Assessment: Report given to RN and Post -op Vital signs reviewed and stable ? ?Post vital signs: Reviewed and stable ? ?Last Vitals:  ?Vitals Value Taken Time  ?BP 124/76 07/08/21 1300  ?Temp 36.6 ?C 07/08/21 1253  ?Pulse 55 07/08/21 1300  ?Resp 16 07/08/21 1300  ?SpO2 100 % 07/08/21 1300  ?Vitals shown include unvalidated device data. ? ?Last Pain:  ?Vitals:  ? 07/08/21 1253  ?TempSrc:   ?PainSc: 0-No pain  ?   ? ?Patients Stated Pain Goal: 1 (07/08/21 0933) ? ?Complications: No notable events documented. ?

## 2021-07-08 NOTE — Op Note (Signed)
Operative Note ? ?Pre-operative Diagnosis: 1. Abnormal uterine bleeding  2. Multiple vulvar inclusion cysts ? ?Post-operative Diagnosis: 2. Submucosal uterine fibroid 2. Multiple vulvar inclusion cysts ? ?Surgeon: Jerelyn Charles, MD ? ?Procedure: hysteroscopic myomectomy with MyoSure, excision of vulvar cysts ? ?Anesthesia: general ? ?Estimated Blood Loss: Minimal ? ?Fluid Deficit: 500 mL ?        ?Specimens: uterine fibroid and vulvar cysts ?        ?Findings:  Diffusely thin endometrium.  Arising from the left posterior uterine wall was a 3 cm submucosal fibroid.  Numerous vulvar cysts of the bilateral labia minora  ? ?Description of Procedure: ?        ?After adequate anesthesia was achieved, the patient placed in the dorsal lithotomy position in [place].  She was prepped and draped in the usual sterile fashion.  The bladder was drained with a catheter.  A bimanual exam revealed an anteverted uterus.  The bivalve speculum was placed in the vagina and the anterior lip of the cervix grasped with a single-tooth tenaculum. 10 mL of 1% lidocaine was injected in a paracervical block.   The cervix was serially dilated with Hank dilators to 71m.  Under direct visualization, the hysteroscope was advanced.  The uterine cavity was surveyed. Both tubal ostia were identified. The endometrium was noted to be diffusely thin.  There was a 3 cm submucosal fibroid arising from the left posterior uterine wall that was not seen on her prior ultrasound from the emergency department.  The MyoSure Reach device was advanced into the uterine cavity.  Under direct visualization, the fibroid was resected fully.  At this time, the vaginal instruments were removed.  The 5 largest cysts were infiltrated with 1% lidocaine.  A 475mpunch biopsy was used to excise them all completely.  Silver nitrate to the base and pressure were used to achieve hemostasis.  At this time, the speculum was placed back in the vagina.  The hysteroscope was  advanced back into the cavity.  A small portion of the submucosal fibroid was now further protruding into the cavity.  Again, under direct visualization, this was resected with the MyoSure until a uniform endometrial cavity was noted.  The fragments of the uterine fibroid and the vulvar cysts were sent to pathology.  The hysteroscope was removed.  All the vaginal instruments were removed.  Counts were correct.  The patient was awakened from anesthesia and transferred to PACU in stable condition.  ? ? ?CJerelyn Charles ?

## 2021-07-08 NOTE — Anesthesia Preprocedure Evaluation (Signed)
Anesthesia Evaluation  ?Patient identified by MRN, date of birth, ID band ?Patient awake ? ? ? ?Reviewed: ?Allergy & Precautions, NPO status , Patient's Chart, lab work & pertinent test results ? ?Airway ?Mallampati: II ? ?TM Distance: >3 FB ?Neck ROM: Full ? ? ? Dental ?no notable dental hx. ?(+) Teeth Intact, Dental Advisory Given ?  ?Pulmonary ?neg pulmonary ROS,  ?  ?Pulmonary exam normal ?breath sounds clear to auscultation ? ? ? ? ? ? Cardiovascular ?negative cardio ROS ?Normal cardiovascular exam ?Rhythm:Regular Rate:Normal ? ? ?  ?Neuro/Psych ? Headaches, PSYCHIATRIC DISORDERS Anxiety Depression   ? GI/Hepatic ?Neg liver ROS, GERD  Medicated and Controlled,  ?Endo/Other  ?Obesity ? Renal/GU ?negative Renal ROS  ?negative genitourinary ?  ?Musculoskeletal ?negative musculoskeletal ROS ?(+)  ? Abdominal ?(+) + obese,   ?Peds ? Hematology ? ?(+) Blood dyscrasia, anemia ,   ?Anesthesia Other Findings ? ? Reproductive/Obstetrics ?AUB ?Uterine polyp ? ?  ? ? ? ? ? ? ? ? ? ? ? ? ? ?  ?  ? ? ? ? ? ? ? ? ?Anesthesia Physical ?Anesthesia Plan ? ?ASA: 2 ? ?Anesthesia Plan: General  ? ?Post-op Pain Management: Ketamine IV*, Precedex and Tylenol PO (pre-op)*  ? ?Induction: Intravenous ? ?PONV Risk Score and Plan: 4 or greater and Treatment may vary due to age or medical condition, Midazolam, Dexamethasone and Ondansetron ? ?Airway Management Planned: LMA ? ?Additional Equipment:  ? ?Intra-op Plan:  ? ?Post-operative Plan: Extubation in OR ? ?Informed Consent: I have reviewed the patients History and Physical, chart, labs and discussed the procedure including the risks, benefits and alternatives for the proposed anesthesia with the patient or authorized representative who has indicated his/her understanding and acceptance.  ? ? ? ?Dental advisory given ? ?Plan Discussed with: CRNA ? ?Anesthesia Plan Comments:   ? ? ? ? ? ? ?Anesthesia Quick Evaluation ? ?

## 2021-07-08 NOTE — Discharge Instructions (Addendum)
? ?  [facility] Home care Instructions: ? ? ?Personal hygiene:  Used sanitary napkins for vaginal drainage not tampons. Shower or tub bathe the day after your procedure. No douching until bleeding stops. Always wipe from front to back after  Elimination. ? ?Activity: Do not drive or operate any equipment today. The effects of the anesthesia are still present and drowsiness may result. Rest today, not necessarily flat bed rest, just take it easy. You may resume your normal activity in one to 2 days. ? ?Sexual activity: No intercourse for one week or as indicated by your physician ? ?Diet: Eat a light diet as desired this evening. You may resume a regular diet tomorrow. ? ?Return to work: One to 2 days. ? ?General Expectations of your surgery: Vaginal bleeding should be no heavier than a normal period. Spotting may continue up to 10 days. Mild cramps may continue for a couple of days. You may have a regular period in 2-6 weeks. ? ?Unexpected observations call your doctor if these occur: persistent or heavy bleeding. Severe abdominal cramping or pain. Elevation of temperature greater than 100?F. ? ?Call for an appointment in one week. ? ?Post Anesthesia Home Care Instructions ? ?Activity: ?Get plenty of rest for the remainder of the day. A responsible individual must stay with you for 24 hours following the procedure.  ?For the next 24 hours, DO NOT: ?-Drive a car ?-Paediatric nurse ?-Drink alcoholic beverages ?-Take any medication unless instructed by your physician ?-Make any legal decisions or sign important papers. ? ?Meals: ?Start with liquid foods such as gelatin or soup. Progress to regular foods as tolerated. Avoid greasy, spicy, heavy foods. If nausea and/or vomiting occur, drink only clear liquids until the nausea and/or vomiting subsides. Call your physician if vomiting continues. ? ?Special Instructions/Symptoms: ?Your throat may feel dry or sore from the anesthesia or the breathing tube placed in your throat  during surgery. If this causes discomfort, gargle with warm salt water. The discomfort should disappear within 24 hours. ? ?No ibuprofen, Advil, Aleve, Motrin, ketorolac, meloxicam, naproxen, or other NSAIDS until after 6:45 pm today if needed. ? ?   ? ?

## 2021-07-09 ENCOUNTER — Encounter (HOSPITAL_BASED_OUTPATIENT_CLINIC_OR_DEPARTMENT_OTHER): Payer: Self-pay | Admitting: Obstetrics

## 2021-07-09 LAB — SURGICAL PATHOLOGY

## 2021-07-10 ENCOUNTER — Other Ambulatory Visit: Payer: Self-pay | Admitting: Obstetrics

## 2021-07-10 ENCOUNTER — Ambulatory Visit
Admission: RE | Admit: 2021-07-10 | Discharge: 2021-07-10 | Disposition: A | Discharge status: Home / Self Care | Payer: Medicaid Other | Source: Ambulatory Visit | Attending: Obstetrics | Admitting: Obstetrics

## 2021-07-10 DIAGNOSIS — N6320 Unspecified lump in the left breast, unspecified quadrant: Secondary | ICD-10-CM

## 2021-08-02 ENCOUNTER — Non-Acute Institutional Stay (HOSPITAL_COMMUNITY)
Admission: RE | Admit: 2021-08-02 | Discharge: 2021-08-02 | Disposition: A | Discharge status: Home / Self Care | Payer: Medicaid Other | Source: Ambulatory Visit | Attending: Internal Medicine | Admitting: Internal Medicine

## 2021-08-02 ENCOUNTER — Encounter (HOSPITAL_COMMUNITY): Payer: Medicaid Other

## 2021-08-02 DIAGNOSIS — D509 Iron deficiency anemia, unspecified: Secondary | ICD-10-CM | POA: Diagnosis not present

## 2021-08-02 MED ORDER — SODIUM CHLORIDE 0.9 % IV SOLN
510.0000 mg | Freq: Once | INTRAVENOUS | Status: AC
  Administered 2021-08-02: 510 mg via INTRAVENOUS
  Filled 2021-08-02: qty 17

## 2021-08-02 MED ORDER — SODIUM CHLORIDE 0.9 % IV SOLN: INTRAVENOUS | Status: DC | PRN

## 2021-08-02 NOTE — Progress Notes (Signed)
PATIENT CARE CENTER NOTE ? ? ?Diagnosis: Iron Deficiency anemia, unspecified  ? ? ?Provider: Jerelyn Charles, MD ? ? ?Procedure: Feraheme infusion  ? ? ?Note:  Patient received Feraheme 510 mg infusion (dose 1 of 2) via PIV. Tolerated infusion well with no adverse reaction. Observed patient for 30 minutes post infusion. Vital signs stable. AVS given to patient. Patient to come back in 1 week for next infusion. Alert, oriented and ambulatory at discharge.   ?

## 2021-08-09 ENCOUNTER — Encounter (HOSPITAL_COMMUNITY): Payer: Medicaid Other

## 2021-08-14 ENCOUNTER — Encounter (HOSPITAL_COMMUNITY): Payer: Medicaid Other

## 2021-08-30 ENCOUNTER — Non-Acute Institutional Stay (HOSPITAL_COMMUNITY)
Admission: RE | Admit: 2021-08-30 | Discharge: 2021-08-30 | Disposition: A | Discharge status: Home / Self Care | Payer: Medicaid Other | Source: Ambulatory Visit | Attending: Internal Medicine | Admitting: Internal Medicine

## 2021-08-30 DIAGNOSIS — D62 Acute posthemorrhagic anemia: Secondary | ICD-10-CM | POA: Insufficient documentation

## 2021-08-30 MED ORDER — SODIUM CHLORIDE 0.9 % IV SOLN: INTRAVENOUS | Status: DC | PRN

## 2021-08-30 MED ORDER — SODIUM CHLORIDE 0.9 % IV SOLN
510.0000 mg | Freq: Once | INTRAVENOUS | Status: AC
  Administered 2021-08-30: 510 mg via INTRAVENOUS
  Filled 2021-08-30: qty 510

## 2021-08-30 NOTE — Progress Notes (Signed)
PATIENT CARE CENTER NOTE     Diagnosis: Iron Deficiency anemia, unspecified      Provider: Jerelyn Charles, MD     Procedure: Feraheme infusion      Note:  Patient received Feraheme 510 mg infusion (dose # 2 of 2) via PIV. Tolerated infusion well with no adverse reaction. Observed patient for 30 minutes post infusion. Vital signs stable. AVS offered but patient refused. Patient alert, oriented and ambulatory at discharge.

## 2021-10-11 ENCOUNTER — Ambulatory Visit
Admission: RE | Admit: 2021-10-11 | Discharge: 2021-10-11 | Disposition: A | Discharge status: Home / Self Care | Payer: Medicaid Other | Source: Ambulatory Visit | Attending: Obstetrics | Admitting: Obstetrics

## 2021-10-11 DIAGNOSIS — N6320 Unspecified lump in the left breast, unspecified quadrant: Secondary | ICD-10-CM

## 2023-01-01 IMAGING — MG DIGITAL DIAGNOSTIC BILAT W/ TOMO W/ CAD
6 of 10 series · 6 of 30 positions shown · non-contrast
Comparison: Previous exam(s).

CLINICAL DATA: 36-year-old female presenting for evaluation pain in
the lump in the left breast at the patient's scar site. History of
breast cancer in the patient's mother at age 61. History of
bilateral duction surgery in 4994.

EXAM:
DIGITAL DIAGNOSTIC BILATERAL MAMMOGRAM WITH TOMOSYNTHESIS AND CAD;
ULTRASOUND LEFT BREAST LIMITED
TECHNIQUE: Bilateral digital diagnostic mammography and breast tomosynthesis
was performed. The images were evaluated with computer-aided
detection.; Targeted ultrasound examination of the left breast was
performed.

[R MLO synth-2D]
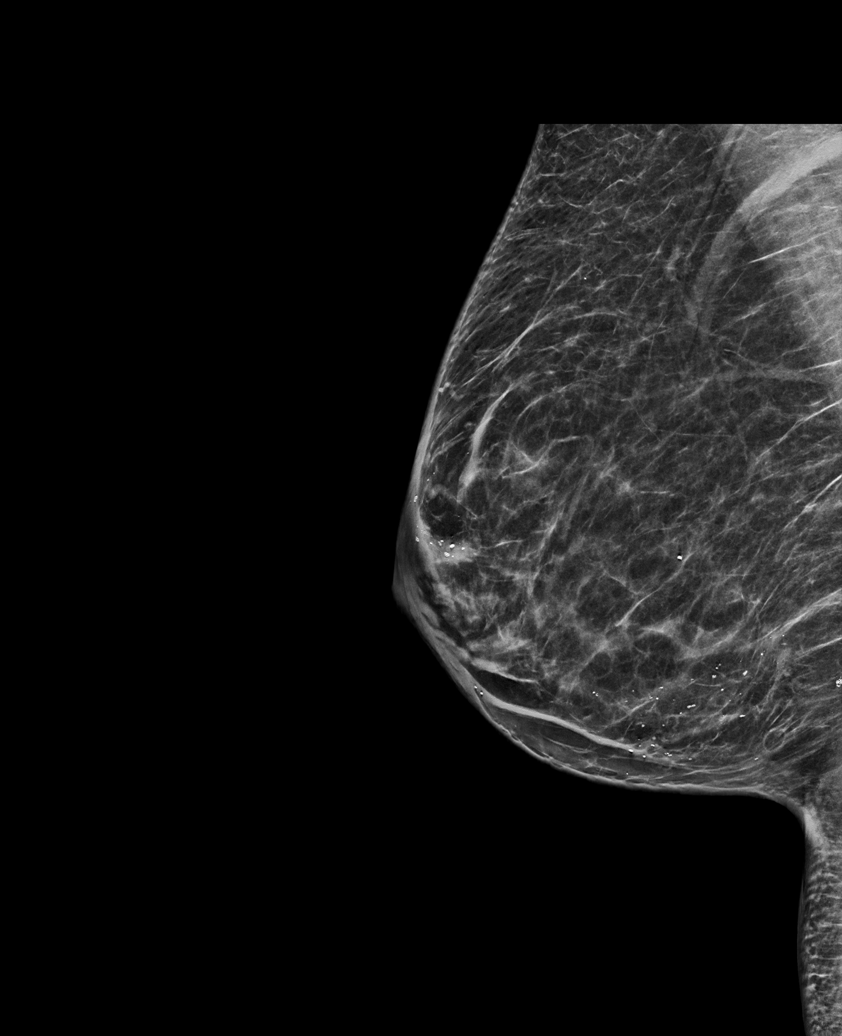

[L MLO synth-2D (1 of 2)]
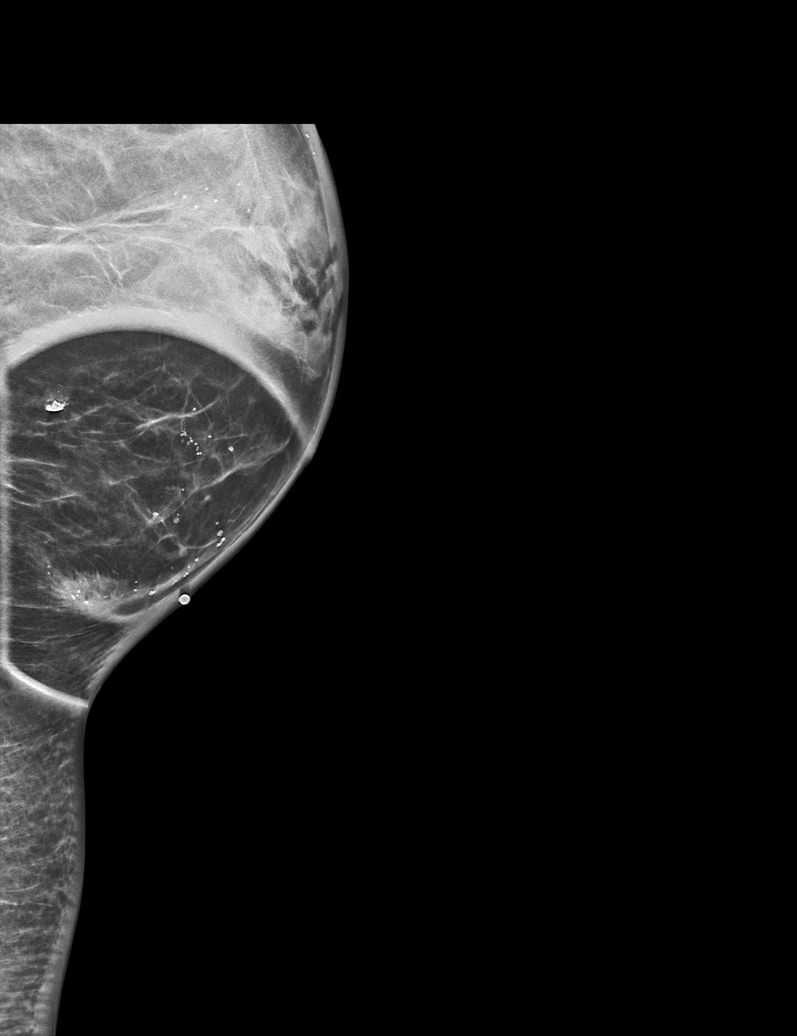

[L MLO synth-2D (2 of 2)]
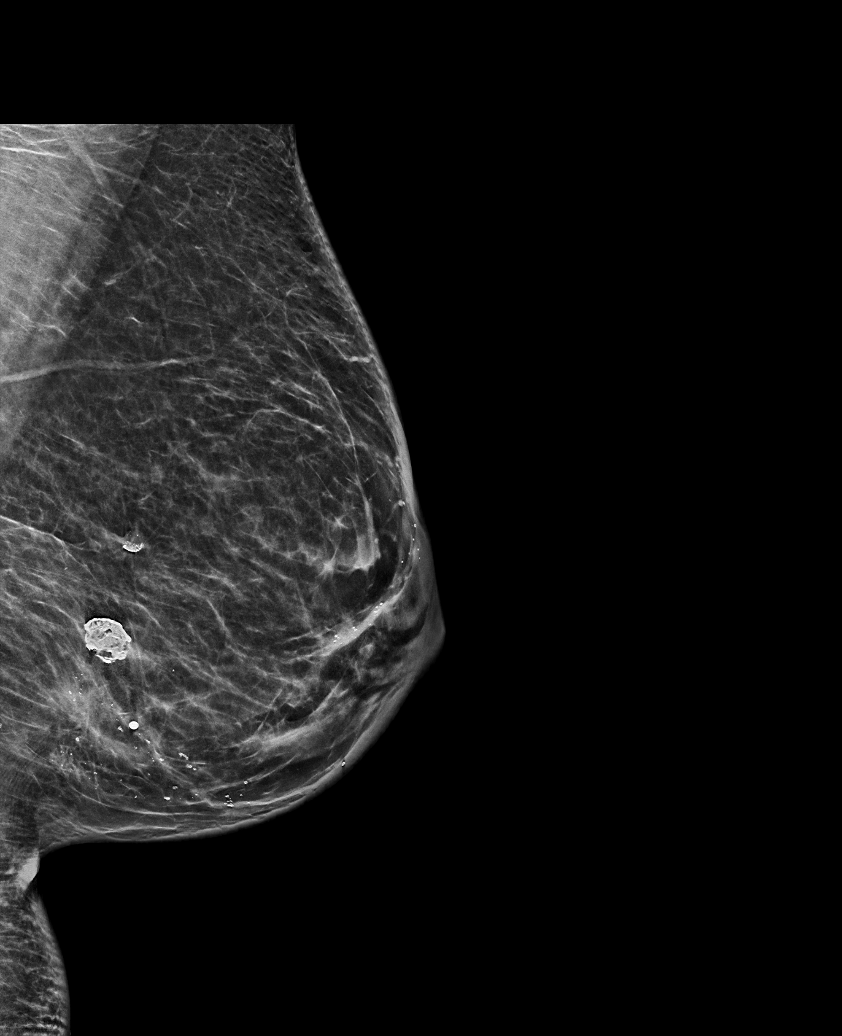

[R CC synth-2D]
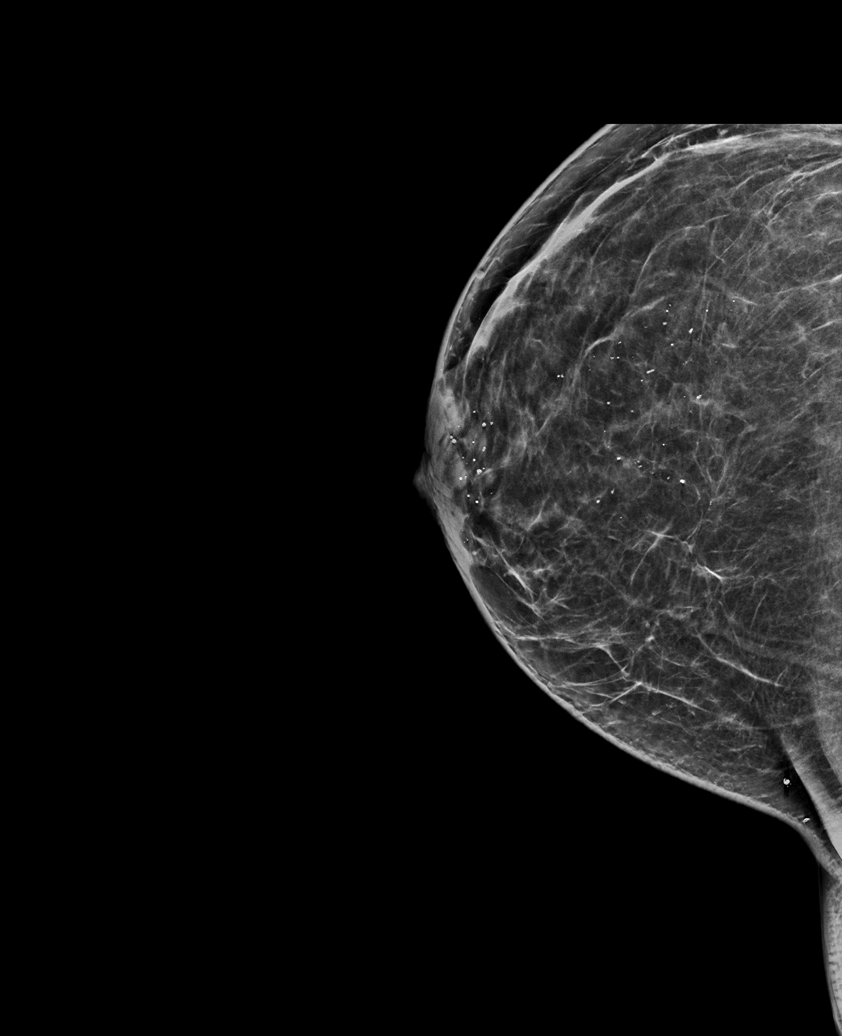

[L CC synth-2D]
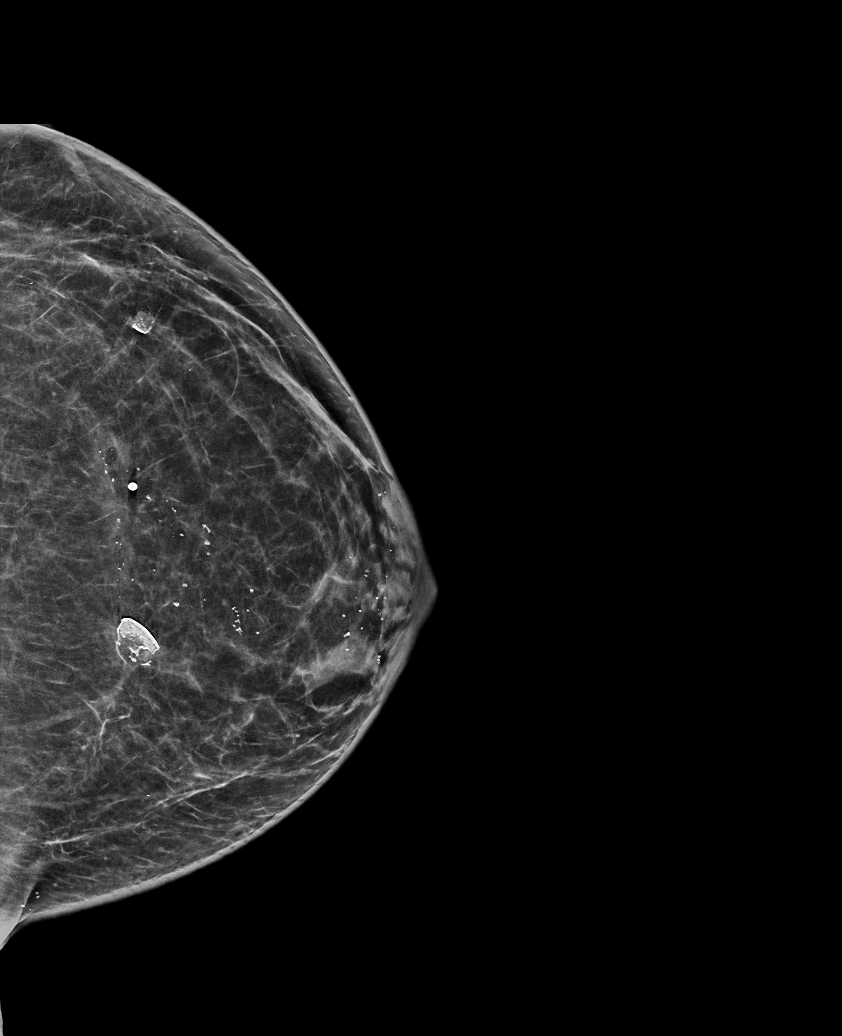

[R CC tomo · tomo slice 35/70.0]
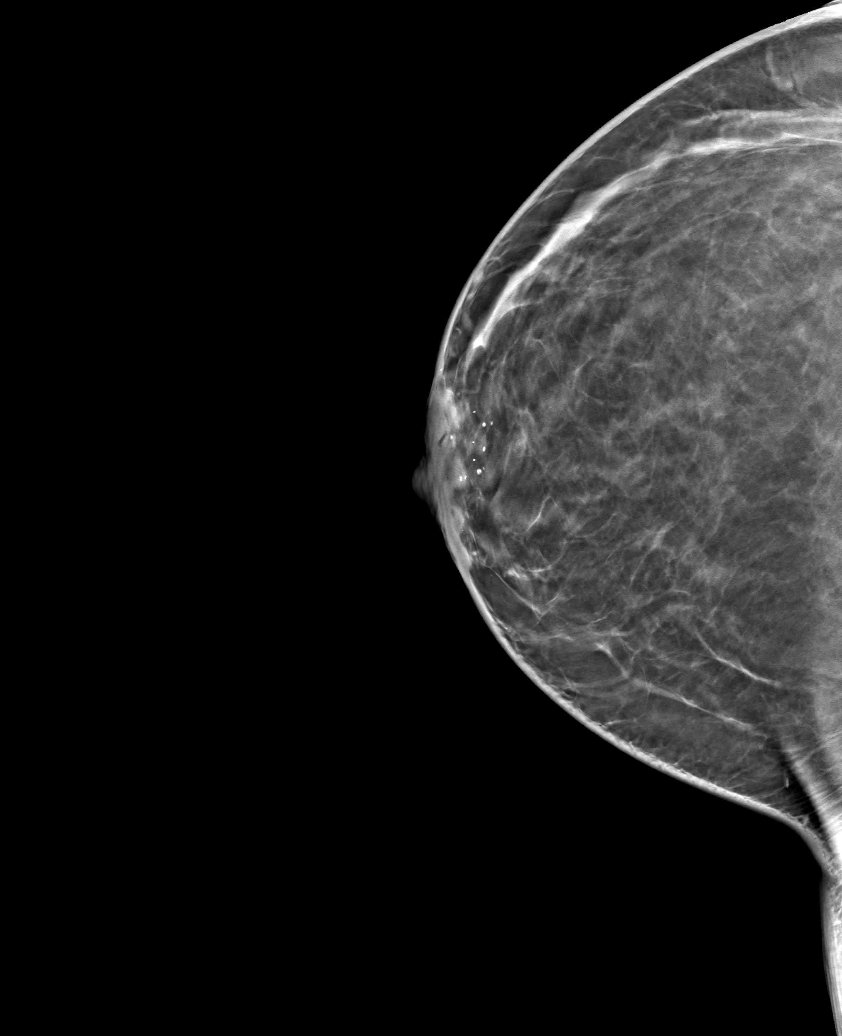

[6 of 30 positions shown; findings below may reference images not displayed]

ACR Breast Density Category b: There are scattered areas of
fibroglandular density.
FINDINGS: Mammogram:

Right breast: No suspicious mass, distortion, or microcalcifications
are identified to suggest presence of malignancy.

Left breast: A skin BB marks the palpable site of concern reported
by the patient along the inferior left breast. At the site of
concern there are postsurgical changes including asymmetry with
calcifications and fat necrosis. There is very subtle increase in
density in this area compared to prior. No new findings elsewhere in
the left breast.

On physical exam at the site of concern reported by the patient I do
not feel a discrete mass. There is thickening along the surgical
scar. There is no erythema or skin breakdown.

Ultrasound:

Targeted ultrasound is performed at the site of concern in the left
breast at 5 o'clock 7 cm from the nipple demonstrating an oval
circumscribed hypoechoic mass containing calcifications measuring
2.3 x 0.6 x 1.1 cm, favored to represent scarring/fat necrosis.
IMPRESSION: At the palpable/painful site of concern in the inferior left breast
there are probably benign findings favored to represent fat
necrosis. Given the very slight increase in density compared to the
prior mammogram in this location recommend short-term follow-up. No
overt signs of infection.

RECOMMENDATION:
Diagnostic left breast mammogram and possible ultrasound in 3
months.

Patient was counseled to contact our office sooner if symptoms
worsen.

I have discussed the findings and recommendations with the patient.
If applicable, a reminder letter will be sent to the patient
regarding the next appointment.

BI-RADS CATEGORY  3: Probably benign.
